# Patient Record
Sex: Female | Born: 1990 | Race: White | Hispanic: No | Marital: Single | Smoking: Current every day smoker
Health system: Southern US, Community
[De-identification: ages and names within clinical notes are randomized; demographics above are authoritative.]

## PROBLEM LIST (undated history)

## (undated) DIAGNOSIS — G43909 Migraine, unspecified, not intractable, without status migrainosus: Secondary | ICD-10-CM

## (undated) DIAGNOSIS — F419 Anxiety disorder, unspecified: Secondary | ICD-10-CM

## (undated) DIAGNOSIS — O1490 Unspecified pre-eclampsia, unspecified trimester: Secondary | ICD-10-CM

## (undated) HISTORY — DX: Anxiety disorder, unspecified: F41.9

## (undated) HISTORY — DX: Migraine, unspecified, not intractable, without status migrainosus: G43.909

## (undated) HISTORY — DX: Unspecified pre-eclampsia, unspecified trimester: O14.90

---

## 2012-08-01 DIAGNOSIS — O1490 Unspecified pre-eclampsia, unspecified trimester: Secondary | ICD-10-CM

## 2017-01-08 ENCOUNTER — Encounter: Payer: Self-pay | Admitting: Adult Health

## 2017-01-08 ENCOUNTER — Encounter (INDEPENDENT_AMBULATORY_CARE_PROVIDER_SITE_OTHER): Payer: Self-pay

## 2017-01-08 ENCOUNTER — Encounter: Payer: Self-pay | Admitting: *Deleted

## 2017-01-08 ENCOUNTER — Ambulatory Visit: Payer: BLUE CROSS/BLUE SHIELD | Admitting: Adult Health

## 2017-01-08 VITALS — BP 124/62 | HR 78 | Resp 16 | Ht 62.0 in | Wt 126.0 lb

## 2017-01-08 DIAGNOSIS — O09219 Supervision of pregnancy with history of pre-term labor, unspecified trimester: Secondary | ICD-10-CM

## 2017-01-08 DIAGNOSIS — N926 Irregular menstruation, unspecified: Secondary | ICD-10-CM | POA: Diagnosis not present

## 2017-01-08 DIAGNOSIS — O09211 Supervision of pregnancy with history of pre-term labor, first trimester: Secondary | ICD-10-CM

## 2017-01-08 DIAGNOSIS — O09891 Supervision of other high risk pregnancies, first trimester: Secondary | ICD-10-CM

## 2017-01-08 DIAGNOSIS — R110 Nausea: Secondary | ICD-10-CM | POA: Diagnosis not present

## 2017-01-08 DIAGNOSIS — Z331 Pregnant state, incidental: Secondary | ICD-10-CM | POA: Diagnosis not present

## 2017-01-08 DIAGNOSIS — O09291 Supervision of pregnancy with other poor reproductive or obstetric history, first trimester: Secondary | ICD-10-CM

## 2017-01-08 DIAGNOSIS — O3680X0 Pregnancy with inconclusive fetal viability, not applicable or unspecified: Secondary | ICD-10-CM | POA: Insufficient documentation

## 2017-01-08 DIAGNOSIS — Z3A01 Less than 8 weeks gestation of pregnancy: Secondary | ICD-10-CM | POA: Insufficient documentation

## 2017-01-08 DIAGNOSIS — O09899 Supervision of other high risk pregnancies, unspecified trimester: Secondary | ICD-10-CM | POA: Insufficient documentation

## 2017-01-08 DIAGNOSIS — Z8759 Personal history of other complications of pregnancy, childbirth and the puerperium: Secondary | ICD-10-CM | POA: Diagnosis not present

## 2017-01-08 DIAGNOSIS — O09299 Supervision of pregnancy with other poor reproductive or obstetric history, unspecified trimester: Secondary | ICD-10-CM | POA: Insufficient documentation

## 2017-01-08 DIAGNOSIS — Z3201 Encounter for pregnancy test, result positive: Secondary | ICD-10-CM | POA: Diagnosis not present

## 2017-01-08 LAB — POCT URINE PREGNANCY: Preg Test, Ur: POSITIVE — AB

## 2017-01-08 MED ORDER — PRENATAL GUMMIES/DHA & FA 0.4-32.5 MG PO CHEW: 2.0000 | CHEWABLE_TABLET | Freq: Every day | ORAL | Status: DC

## 2017-01-08 NOTE — Patient Instructions (Signed)
First Trimester of Pregnancy The first trimester of pregnancy is from week 1 until the end of week 13 (months 1 through 3). A week after a sperm fertilizes an egg, the egg will implant on the wall of the uterus. This embryo will begin to develop into a baby. Genes from you and your partner will form the baby. The female genes will determine whether the baby will be a boy or a girl. At 6-8 weeks, the eyes and face will be formed, and the heartbeat can be seen on ultrasound. At the end of 12 weeks, all the baby's organs will be formed. Now that you are pregnant, you will want to do everything you can to have a healthy baby. Two of the most important things are to get good prenatal care and to follow your health care provider's instructions. Prenatal care is all the medical care you receive before the baby's birth. This care will help prevent, find, and treat any problems during the pregnancy and childbirth. Body changes during your first trimester Your body goes through many changes during pregnancy. The changes vary from woman to woman.  You may gain or lose a couple of pounds at first.  You may feel sick to your stomach (nauseous) and you may throw up (vomit). If the vomiting is uncontrollable, call your health care provider.  You may tire easily.  You may develop headaches that can be relieved by medicines. All medicines should be approved by your health care provider.  You may urinate more often. Painful urination may mean you have a bladder infection.  You may develop heartburn as a result of your pregnancy.  You may develop constipation because certain hormones are causing the muscles that push stool through your intestines to slow down.  You may develop hemorrhoids or swollen veins (varicose veins).  Your breasts may begin to grow larger and become tender. Your nipples may stick out more, and the tissue that surrounds them (areola) may become darker.  Your gums may bleed and may be  sensitive to brushing and flossing.  Dark spots or blotches (chloasma, mask of pregnancy) may develop on your face. This will likely fade after the baby is born.  Your menstrual periods will stop.  You may have a loss of appetite.  You may develop cravings for certain kinds of food.  You may have changes in your emotions from day to day, such as being excited to be pregnant or being concerned that something may go wrong with the pregnancy and baby.  You may have more vivid and strange dreams.  You may have changes in your hair. These can include thickening of your hair, rapid growth, and changes in texture. Some women also have hair loss during or after pregnancy, or hair that feels dry or thin. Your hair will most likely return to normal after your baby is born.  What to expect at prenatal visits During a routine prenatal visit:  You will be weighed to make sure you and the baby are growing normally.  Your blood pressure will be taken.  Your abdomen will be measured to track your baby's growth.  The fetal heartbeat will be listened to between weeks 10 and 14 of your pregnancy.  Test results from any previous visits will be discussed.  Your health care provider may ask you:  How you are feeling.  If you are feeling the baby move.  If you have had any abnormal symptoms, such as leaking fluid, bleeding, severe headaches,   or abdominal cramping.  If you are using any tobacco products, including cigarettes, chewing tobacco, and electronic cigarettes.  If you have any questions.  Other tests that may be performed during your first trimester include:  Blood tests to find your blood type and to check for the presence of any previous infections. The tests will also be used to check for low iron levels (anemia) and protein on red blood cells (Rh antibodies). Depending on your risk factors, or if you previously had diabetes during pregnancy, you may have tests to check for high blood  sugar that affects pregnant women (gestational diabetes).  Urine tests to check for infections, diabetes, or protein in the urine.  An ultrasound to confirm the proper growth and development of the baby.  Fetal screens for spinal cord problems (spina bifida) and Down syndrome.  HIV (human immunodeficiency virus) testing. Routine prenatal testing includes screening for HIV, unless you choose not to have this test.  You may need other tests to make sure you and the baby are doing well.  Follow these instructions at home: Medicines  Follow your health care provider's instructions regarding medicine use. Specific medicines may be either safe or unsafe to take during pregnancy.  Take a prenatal vitamin that contains at least 600 micrograms (mcg) of folic acid.  If you develop constipation, try taking a stool softener if your health care provider approves. Eating and drinking  Eat a balanced diet that includes fresh fruits and vegetables, whole grains, good sources of protein such as meat, eggs, or tofu, and low-fat dairy. Your health care provider will help you determine the amount of weight gain that is right for you.  Avoid raw meat and uncooked cheese. These carry germs that can cause birth defects in the baby.  Eating four or five small meals rather than three large meals a day may help relieve nausea and vomiting. If you start to feel nauseous, eating a few soda crackers can be helpful. Drinking liquids between meals, instead of during meals, also seems to help ease nausea and vomiting.  Limit foods that are high in fat and processed sugars, such as fried and sweet foods.  To prevent constipation: ? Eat foods that are high in fiber, such as fresh fruits and vegetables, whole grains, and beans. ? Drink enough fluid to keep your urine clear or pale yellow. Activity  Exercise only as directed by your health care provider. Most women can continue their usual exercise routine during  pregnancy. Try to exercise for 30 minutes at least 5 days a week. Exercising will help you: ? Control your weight. ? Stay in shape. ? Be prepared for labor and delivery.  Experiencing pain or cramping in the lower abdomen or lower back is a good sign that you should stop exercising. Check with your health care provider before continuing with normal exercises.  Try to avoid standing for long periods of time. Move your legs often if you must stand in one place for a long time.  Avoid heavy lifting.  Wear low-heeled shoes and practice good posture.  You may continue to have sex unless your health care provider tells you not to. Relieving pain and discomfort  Wear a good support bra to relieve breast tenderness.  Take warm sitz baths to soothe any pain or discomfort caused by hemorrhoids. Use hemorrhoid cream if your health care provider approves.  Rest with your legs elevated if you have leg cramps or low back pain.  If you develop   varicose veins in your legs, wear support hose. Elevate your feet for 15 minutes, 3-4 times a day. Limit salt in your diet. Prenatal care  Schedule your prenatal visits by the twelfth week of pregnancy. They are usually scheduled monthly at first, then more often in the last 2 months before delivery.  Write down your questions. Take them to your prenatal visits.  Keep all your prenatal visits as told by your health care provider. This is important. Safety  Wear your seat belt at all times when driving.  Make a list of emergency phone numbers, including numbers for family, friends, the hospital, and police and fire departments. General instructions  Ask your health care provider for a referral to a local prenatal education class. Begin classes no later than the beginning of month 6 of your pregnancy.  Ask for help if you have counseling or nutritional needs during pregnancy. Your health care provider can offer advice or refer you to specialists for help  with various needs.  Do not use hot tubs, steam rooms, or saunas.  Do not douche or use tampons or scented sanitary pads.  Do not cross your legs for long periods of time.  Avoid cat litter boxes and soil used by cats. These carry germs that can cause birth defects in the baby and possibly loss of the fetus by miscarriage or stillbirth.  Avoid all smoking, herbs, alcohol, and medicines not prescribed by your health care provider. Chemicals in these products affect the formation and growth of the baby.  Do not use any products that contain nicotine or tobacco, such as cigarettes and e-cigarettes. If you need help quitting, ask your health care provider. You may receive counseling support and other resources to help you quit.  Schedule a dentist appointment. At home, brush your teeth with a soft toothbrush and be gentle when you floss. Contact a health care provider if:  You have dizziness.  You have mild pelvic cramps, pelvic pressure, or nagging pain in the abdominal area.  You have persistent nausea, vomiting, or diarrhea.  You have a bad smelling vaginal discharge.  You have pain when you urinate.  You notice increased swelling in your face, hands, legs, or ankles.  You are exposed to fifth disease or chickenpox.  You are exposed to German measles (rubella) and have never had it. Get help right away if:  You have a fever.  You are leaking fluid from your vagina.  You have spotting or bleeding from your vagina.  You have severe abdominal cramping or pain.  You have rapid weight gain or loss.  You vomit blood or material that looks like coffee grounds.  You develop a severe headache.  You have shortness of breath.  You have any kind of trauma, such as from a fall or a car accident. Summary  The first trimester of pregnancy is from week 1 until the end of week 13 (months 1 through 3).  Your body goes through many changes during pregnancy. The changes vary from  woman to woman.  You will have routine prenatal visits. During those visits, your health care provider will examine you, discuss any test results you may have, and talk with you about how you are feeling. This information is not intended to replace advice given to you by your health care provider. Make sure you discuss any questions you have with your health care provider. Document Released: 01/01/2001 Document Revised: 12/20/2015 Document Reviewed: 12/20/2015 [employer] Interactive Patient Education  2018 [employer]  

## 2017-01-08 NOTE — Progress Notes (Signed)
Subjective:     Patient ID: Kimberly Foster, female   DOB: 04/29/1990, 26 y.o.   MRN: [id number]  HPI Kimberly Foster is a 26 year old white female in for UPT. She has history of pre eclampsia and preterm delivery.   Review of Systems +missed period +nausea  Reviewed past medical,surgical, social and family history. Reviewed medications and allergies.     Objective:   Physical Exam BP 124/62 (BP Location: Left Arm, Patient Position: Sitting, Cuff Size: Normal)   Pulse 78   Resp 16   Ht 5\' 2"  (1.575 m)   Wt 126 lb (57.2 kg)   LMP 11/18/2016 (Exact Date)   BMI 23.05 kg/m UPT+, about 7+3 weeks by LMP with EDD 08/25/17.Skin warm and dry. Neck: mid line trachea, normal thyroid, good ROM, no lymphadenopathy noted. Lungs: clear to ausculation bilaterally. Cardiovascular: regular rate and rhythm.Abdomen is soft and non tender. PHQ 2 score 0.   [facility] and about after hours call service.   Assessment:     1. Pregnancy test positive   2. Less than [redacted] weeks gestation of pregnancy   3. Encounter to determine fetal viability of pregnancy, single or unspecified fetus   4. History of pre-eclampsia in prior pregnancy, currently pregnant in first trimester   5. History of preterm delivery, currently pregnant in first trimester       Plan:    Eat often Take Gummies Return in 2 weeks for dating US Review handouts on first trimester and by [facility]  [facility]

## 2017-01-20 ENCOUNTER — Encounter: Payer: Self-pay | Admitting: *Deleted

## 2017-01-20 DIAGNOSIS — F419 Anxiety disorder, unspecified: Secondary | ICD-10-CM | POA: Insufficient documentation

## 2017-01-22 ENCOUNTER — Ambulatory Visit (INDEPENDENT_AMBULATORY_CARE_PROVIDER_SITE_OTHER): Payer: BLUE CROSS/BLUE SHIELD

## 2017-01-22 DIAGNOSIS — O3680X0 Pregnancy with inconclusive fetal viability, not applicable or unspecified: Secondary | ICD-10-CM | POA: Diagnosis not present

## 2017-01-22 DIAGNOSIS — Z3A09 9 weeks gestation of pregnancy: Secondary | ICD-10-CM | POA: Diagnosis not present

## 2017-01-22 NOTE — Progress Notes (Signed)
US 9+2 wks,single IUP w/ys,positive fht 166 bpm,normal ovaries bilat,crl 23.3 mm,EDD 08/25/2017 by LMP

## 2017-02-05 ENCOUNTER — Ambulatory Visit: Payer: BLUE CROSS/BLUE SHIELD | Admitting: *Deleted

## 2017-02-05 ENCOUNTER — Ambulatory Visit (INDEPENDENT_AMBULATORY_CARE_PROVIDER_SITE_OTHER): Payer: BLUE CROSS/BLUE SHIELD | Admitting: Advanced Practice Midwife

## 2017-02-05 ENCOUNTER — Encounter: Payer: Self-pay | Admitting: Advanced Practice Midwife

## 2017-02-05 VITALS — BP 110/84 | HR 76 | Ht 61.0 in | Wt 125.0 lb

## 2017-02-05 DIAGNOSIS — O99321 Drug use complicating pregnancy, first trimester: Secondary | ICD-10-CM

## 2017-02-05 DIAGNOSIS — F17210 Nicotine dependence, cigarettes, uncomplicated: Secondary | ICD-10-CM

## 2017-02-05 DIAGNOSIS — O09291 Supervision of pregnancy with other poor reproductive or obstetric history, first trimester: Secondary | ICD-10-CM

## 2017-02-05 DIAGNOSIS — Z3A11 11 weeks gestation of pregnancy: Secondary | ICD-10-CM

## 2017-02-05 DIAGNOSIS — O09211 Supervision of pregnancy with history of pre-term labor, first trimester: Secondary | ICD-10-CM

## 2017-02-05 DIAGNOSIS — O9989 Other specified diseases and conditions complicating pregnancy, childbirth and the puerperium: Secondary | ICD-10-CM

## 2017-02-05 DIAGNOSIS — Z3682 Encounter for antenatal screening for nuchal translucency: Secondary | ICD-10-CM

## 2017-02-05 DIAGNOSIS — Z3481 Encounter for supervision of other normal pregnancy, first trimester: Secondary | ICD-10-CM

## 2017-02-05 DIAGNOSIS — O09891 Supervision of other high risk pregnancies, first trimester: Secondary | ICD-10-CM

## 2017-02-05 DIAGNOSIS — R51 Headache: Secondary | ICD-10-CM

## 2017-02-05 DIAGNOSIS — Z331 Pregnant state, incidental: Secondary | ICD-10-CM

## 2017-02-05 DIAGNOSIS — Z1389 Encounter for screening for other disorder: Secondary | ICD-10-CM

## 2017-02-05 DIAGNOSIS — O34219 Maternal care for unspecified type scar from previous cesarean delivery: Secondary | ICD-10-CM

## 2017-02-05 DIAGNOSIS — F17200 Nicotine dependence, unspecified, uncomplicated: Secondary | ICD-10-CM | POA: Insufficient documentation

## 2017-02-05 DIAGNOSIS — O0990 Supervision of high risk pregnancy, unspecified, unspecified trimester: Secondary | ICD-10-CM | POA: Insufficient documentation

## 2017-02-05 LAB — POCT URINALYSIS DIPSTICK
GLUCOSE UA: NEGATIVE
Ketones, UA: NEGATIVE
Leukocytes, UA: NEGATIVE
Nitrite, UA: POSITIVE
Protein, UA: NEGATIVE
RBC UA: NEGATIVE

## 2017-02-05 MED ORDER — BUTALBITAL-APAP-CAFFEINE 50-325-40 MG PO TABS: 1.0000 | ORAL_TABLET | Freq: Four times a day (QID) | ORAL | 0 refills | Status: AC | PRN

## 2017-02-05 MED ORDER — ASPIRIN EC 81 MG PO TBEC: 81.0000 mg | DELAYED_RELEASE_TABLET | Freq: Every day | ORAL | 6 refills | Status: DC

## 2017-02-05 NOTE — Patient Instructions (Addendum)
 First Trimester of Pregnancy The first trimester of pregnancy is from week 1 until the end of week 12 (months 1 through 3). A week after a sperm fertilizes an egg, the egg will implant on the wall of the uterus. This embryo will begin to develop into a baby. Genes from you and your partner are forming the baby. The female genes determine whether the baby is a boy or a girl. At 6-8 weeks, the eyes and face are formed, and the heartbeat can be seen on ultrasound. At the end of 12 weeks, all the baby's organs are formed.  Now that you are pregnant, you will want to do everything you can to have a healthy baby. Two of the most important things are to get good prenatal care and to follow your health care provider's instructions. Prenatal care is all the medical care you receive before the baby's birth. This care will help prevent, find, and treat any problems during the pregnancy and childbirth. BODY CHANGES Your body goes through many changes during pregnancy. The changes vary from woman to woman.   You may gain or lose a couple of pounds at first.  You may feel sick to your stomach (nauseous) and throw up (vomit). If the vomiting is uncontrollable, call your health care provider.  You may tire easily.  You may develop headaches that can be relieved by medicines approved by your health care provider.  You may urinate more often. Painful urination may mean you have a bladder infection.  You may develop heartburn as a result of your pregnancy.  You may develop constipation because certain hormones are causing the muscles that push waste through your intestines to slow down.  You may develop hemorrhoids or swollen, bulging veins (varicose veins).  Your breasts may begin to grow larger and become tender. Your nipples may stick out more, and the tissue that surrounds them (areola) may become darker.  Your gums may bleed and may be sensitive to brushing and flossing.  Dark spots or blotches  (chloasma, mask of pregnancy) may develop on your face. This will likely fade after the baby is born.  Your menstrual periods will stop.  You may have a loss of appetite.  You may develop cravings for certain kinds of food.  You may have changes in your emotions from day to day, such as being excited to be pregnant or being concerned that something may go wrong with the pregnancy and baby.  You may have more vivid and strange dreams.  You may have changes in your hair. These can include thickening of your hair, rapid growth, and changes in texture. Some women also have hair loss during or after pregnancy, or hair that feels dry or thin. Your hair will most likely return to normal after your baby is born. WHAT TO EXPECT AT YOUR PRENATAL VISITS During a routine prenatal visit:  You will be weighed to make sure you and the baby are growing normally.  Your blood pressure will be taken.  Your abdomen will be measured to track your baby's growth.  The fetal heartbeat will be listened to starting around week 10 or 12 of your pregnancy.  Test results from any previous visits will be discussed. Your health care provider may ask you:  How you are feeling.  If you are feeling the baby move.  If you have had any abnormal symptoms, such as leaking fluid, bleeding, severe headaches, or abdominal cramping.  If you have any questions. Other   tests that may be performed during your first trimester include:  Blood tests to find your blood type and to check for the presence of any previous infections. They will also be used to check for low iron levels (anemia) and Rh antibodies. Later in the pregnancy, blood tests for diabetes will be done along with other tests if problems develop.  Urine tests to check for infections, diabetes, or protein in the urine.  An ultrasound to confirm the proper growth and development of the baby.  An amniocentesis to check for possible genetic problems.  Fetal  screens for spina bifida and Down syndrome.  You may need other tests to make sure you and the baby are doing well. HOME CARE INSTRUCTIONS  Medicines  Follow your health care provider's instructions regarding medicine use. Specific medicines may be either safe or unsafe to take during pregnancy.  Take your prenatal vitamins as directed.  If you develop constipation, try taking a stool softener if your health care provider approves. Diet  Eat regular, well-balanced meals. Choose a variety of foods, such as meat or vegetable-based protein, fish, milk and low-fat dairy products, vegetables, fruits, and whole grain breads and cereals. Your health care provider will help you determine the amount of weight gain that is right for you.  Avoid raw meat and uncooked cheese. These carry germs that can cause birth defects in the baby.  Eating four or five small meals rather than three large meals a day may help relieve nausea and vomiting. If you start to feel nauseous, eating a few soda crackers can be helpful. Drinking liquids between meals instead of during meals also seems to help nausea and vomiting.  If you develop constipation, eat more high-fiber foods, such as fresh vegetables or fruit and whole grains. Drink enough fluids to keep your urine clear or pale yellow. Activity and Exercise  Exercise only as directed by your health care provider. Exercising will help you:  Control your weight.  Stay in shape.  Be prepared for labor and delivery.  Experiencing pain or cramping in the lower abdomen or low back is a good sign that you should stop exercising. Check with your health care provider before continuing normal exercises.  Try to avoid standing for long periods of time. Move your legs often if you must stand in one place for a long time.  Avoid heavy lifting.  Wear low-heeled shoes, and practice good posture.  You may continue to have sex unless your health care provider directs you  otherwise. Relief of Pain or Discomfort  Wear a good support bra for breast tenderness.   Take warm sitz baths to soothe any pain or discomfort caused by hemorrhoids. Use hemorrhoid cream if your health care provider approves.   Rest with your legs elevated if you have leg cramps or low back pain.  If you develop varicose veins in your legs, wear support hose. Elevate your feet for 15 minutes, 3-4 times a day. Limit salt in your diet. Prenatal Care  Schedule your prenatal visits by the twelfth week of pregnancy. They are usually scheduled monthly at first, then more often in the last 2 months before delivery.  Write down your questions. Take them to your prenatal visits.  Keep all your prenatal visits as directed by your health care provider. Safety  Wear your seat belt at all times when driving.  Make a list of emergency phone numbers, including numbers for family, friends, the hospital, and police and fire departments. General   Tips  Ask your health care provider for a referral to a local prenatal education class. Begin classes no later than at the beginning of month 6 of your pregnancy.  Ask for help if you have counseling or nutritional needs during pregnancy. Your health care provider can offer advice or refer you to specialists for help with various needs.  Do not use hot tubs, steam rooms, or saunas.  Do not douche or use tampons or scented sanitary pads.  Do not cross your legs for long periods of time.  Avoid cat litter boxes and soil used by cats. These carry germs that can cause birth defects in the baby and possibly loss of the fetus by miscarriage or stillbirth.  Avoid all smoking, herbs, alcohol, and medicines not prescribed by your health care provider. Chemicals in these affect the formation and growth of the baby.  Schedule a dentist appointment. At home, brush your teeth with a soft toothbrush and be gentle when you floss. SEEK MEDICAL CARE IF:   You have  dizziness.  You have mild pelvic cramps, pelvic pressure, or nagging pain in the abdominal area.  You have persistent nausea, vomiting, or diarrhea.  You have a bad smelling vaginal discharge.  You have pain with urination.  You notice increased swelling in your face, hands, legs, or ankles. SEEK IMMEDIATE MEDICAL CARE IF:   You have a fever.  You are leaking fluid from your vagina.  You have spotting or bleeding from your vagina.  You have severe abdominal cramping or pain.  You have rapid weight gain or loss.  You vomit blood or material that looks like coffee grounds.  You are exposed to German measles and have never had them.  You are exposed to fifth disease or chickenpox.  You develop a severe headache.  You have shortness of breath.  You have any kind of trauma, such as from a fall or a car accident. Document Released: 01/01/2001 Document Revised: 05/24/2013 Document Reviewed: 11/17/2012 Exit[employer] Patient Information 2015 [employer]. This information is not intended to replace advice given to you by your health care provider. Make sure you discuss any questions you have with your health care provider.   Nausea & Vomiting  Have saltine crackers or pretzels by your bed and eat a few bites before you raise your head out of bed in the morning  Eat small frequent meals throughout the day instead of large meals  Drink plenty of fluids throughout the day to stay hydrated, just don't drink a lot of fluids with your meals.  This can make your stomach fill up faster making you feel sick  Do not brush your teeth right after you eat  Products with real ginger are good for nausea, like ginger ale and ginger hard candy Make sure it says made with real ginger!  Sucking on sour candy like lemon heads is also good for nausea  If your prenatal vitamins make you nauseated, take them at night so you will sleep through the nausea  Sea Bands  If you feel like you need  medicine for the nausea & vomiting please let us know  If you are unable to keep any fluids or food down please let us know   Constipation  Drink plenty of fluid, preferably water, throughout the day  Eat foods high in fiber such as fruits, vegetables, and grains  Exercise, such as walking, is a good way to keep your bowels regular  Drink warm fluids, especially warm   prune juice, or decaf coffee  Eat a 1/2 cup of real oatmeal (not instant), 1/2 cup applesauce, and 1/2-1 cup warm prune juice every day  If needed, you may take Colace (docusate sodium) stool softener once or twice a day to help keep the stool soft. If you are pregnant, wait until you are out of your first trimester (12-14 weeks of pregnancy)  If you still are having problems with constipation, you may take Miralax once daily as needed to help keep your bowels regular.  If you are pregnant, wait until you are out of your first trimester (12-14 weeks of pregnancy)  Safe Medications in Pregnancy   Acne: Benzoyl Peroxide Salicylic Acid  Backache/Headache: Tylenol: 2 regular strength every 4 hours OR              2 Extra strength every 6 hours  Colds/Coughs/Allergies: Benadryl (alcohol free) 25 mg every 6 hours as needed Breath right strips Claritin Cepacol throat lozenges Chloraseptic throat spray Cold-Eeze- up to three times per day Cough drops, alcohol free Flonase (by prescription only) Guaifenesin Mucinex Robitussin DM (plain only, alcohol free) Saline nasal spray/drops Sudafed (pseudoephedrine) & Actifed ** use only after [redacted] weeks gestation and if you do not have high blood pressure Tylenol Vicks Vaporub Zinc lozenges Zyrtec   Constipation: Colace Ducolax suppositories Fleet enema Glycerin suppositories Metamucil Milk of magnesia Miralax Senokot Smooth move tea  Diarrhea: Kaopectate Imodium A-D  *NO pepto Bismol  Hemorrhoids: Anusol Anusol HC Preparation  H Tucks  Indigestion: Tums Maalox Mylanta Zantac  Pepcid  Insomnia: Benadryl (alcohol free) 25mg every 6 hours as needed Tylenol PM Unisom, no Gelcaps  Leg Cramps: Tums MagGel  Nausea/Vomiting:  Bonine Dramamine Emetrol Ginger extract Sea bands Meclizine  Nausea medication to take during pregnancy:  Unisom (doxylamine succinate 25 mg tablets) Take one tablet daily at bedtime. If symptoms are not adequately controlled, the dose can be increased to a maximum recommended dose of two tablets daily (1/2 tablet in the morning, 1/2 tablet mid-afternoon and one at bedtime). Vitamin B6 100mg tablets. Take one tablet twice a day (up to 200 mg per day).  Skin Rashes: Aveeno products Benadryl cream or 25mg every 6 hours as needed Calamine Lotion 1% cortisone cream  Yeast infection: Gyne-lotrimin 7 Monistat 7   **If taking multiple medications, please check labels to avoid duplicating the same active ingredients **take medication as directed on the label ** Do not exceed 4000 mg of tylenol in 24 hours **Do not take medications that contain aspirin or ibuprofen     For Headaches:   Stay well hydrated, drink enough water so that your urine is clear, sometimes if you are dehydrated you can get headaches  Eat small frequent meals and snacks, sometimes if you are hungry you can get headaches  Sometimes you get headaches during pregnancy from the pregnancy hormones  You can try tylenol (1-2 regular strength 325mg or 1-2 extra strength 500mg) as directed on the box. The least amount of medication that works is best.   Cool compresses (cool wet washcloth or ice pack) to area of head that is hurting  You can also try drinking a caffeinated drink to see if this will help  If not helping, try below:  For Prevention of Headaches/Migraines:  CoQ10 100mg three times daily  Vitamin B2 400mg daily  Magnesium Oxide 400-600mg daily  If You Get a Bad  Headache/Migraine:  Benadryl 25mg   Magnesium Oxide  1 large Gatorade  2 extra   strength Tylenol (1,000mg total)  1 cup coffee or Coke  If this doesn't help please call us @ [phone]   

## 2017-02-05 NOTE — Progress Notes (Signed)
Subjective:    Kimberly Foster is a G2P0101 3339w2d being seen today for her first obstetrical visit.  Her obstetrical history is significant for 31 C/S for Severe Preeclampsia. .Had planned outpt mgt, but then had NRFHR undergo a trial of labor.  maybe TOLAC.  Pregnancy history fully reviewed.  Patient has daily HAs.   Vitals:   02/05/17 1015 02/05/17 1016  BP: 110/84   Pulse: 76   Weight: 125 lb (56.7 kg)   Height:  5\' 1"  (1.549 m)    HISTORY: OB History  Gravida Para Term Preterm AB Living  2 1 0 1   1  SAB TAB Ectopic Multiple Live Births          1    # Outcome Date GA Lbr Len/2nd Weight Sex Delivery Anes PTL Lv  2 Current           1 Preterm 08/01/12 182w0d  2 lb 2.7 oz (0.983 kg) M CS-LTranv Other N LIV     Complications: Preeclampsia,Fetal distress before labor in liveborn infant     Birth Comments: pre eclampsia     Past Medical History:  Diagnosis Date  . Anxiety   . Migraines   . Pre-eclampsia    Past Surgical History:  Procedure Laterality Date  . CESAREAN SECTION     Family History  Problem Relation Age of Onset  . Mesothelioma Paternal Grandfather   . Hypertension Paternal Grandfather   . COPD Paternal Grandfather   . Leukemia Paternal Grandmother   . Hypertension Maternal Grandmother   . Hypertension Maternal Grandfather   . Heart attack Maternal Grandfather   . Heart failure Maternal Grandfather   . Hypertension Father   . Diverticulitis Father   . Hypertension Mother   . Cancer Paternal Uncle   . Arrhythmia Maternal Uncle   . Diabetes Paternal Aunt      Exam                                      System:     Skin: normal coloration and turgor, no rashes    Neurologic: oriented, normal, normal mood   Extremities: normal strength, tone, and muscle mass   HEENT PERRLA   Mouth/Teeth mucous membranes moist, normal dentition   Neck supple and no masses   Cardiovascular: regular rate and rhythm   Respiratory:  appears well, vitals  normal, no respiratory distress, acyanotic   Abdomen: soft, non-tender;  FHR: 160US        The [facility] with multiple MDs and other Advanced Practice Providers was explained to patient; also emphasized that residents, students are part of our team.  Assessment:    Pregnancy: G2P0101 Patient Active Problem List   Diagnosis Date Noted  . Supervision of normal pregnancy 02/05/2017  . Smoker 02/05/2017  . Anxiety 01/20/2017  . Pregnancy test positive 01/08/2017  . History of preterm delivery, currently pregnant in first trimester 01/08/2017  . History of pre-eclampsia in prior pregnancy, currently pregnant in first trimester 01/08/2017        Plan:     Initial labs drawn. Continue prenatal vitamins  Records from CS requested ASA 81mg  >12 weeks Post dated rx (for 12 weeks) for fioricet prn HA Problem list reviewed and updated  Reviewed n/v relief measures and warning s/s to report  Reviewed recommended weight gain based on pre-gravid BMI  Encouraged well-balanced diet Genetic Screening discussed Integrated Screen: requested.  Ultrasound discussed; fetal survey: requested.  Return in about 2 weeks (around 02/21/2017) for NT/IT/LROB.  CRESENZO-DISHMAN,Renuka Farfan 02/05/2017

## 2017-02-06 LAB — OBSTETRIC PANEL, INCLUDING HIV
Antibody Screen: NEGATIVE
BASOS ABS: 0 10*3/uL (ref 0.0–0.2)
Basos: 0 %
EOS (ABSOLUTE): 0.1 10*3/uL (ref 0.0–0.4)
EOS: 1 %
HIV Screen 4th Generation wRfx: NONREACTIVE
Hematocrit: 37.7 % (ref 34.0–46.6)
Hemoglobin: 13 g/dL (ref 11.1–15.9)
Hepatitis B Surface Ag: NEGATIVE
IMMATURE GRANULOCYTES: 0 %
Immature Grans (Abs): 0 10*3/uL (ref 0.0–0.1)
LYMPHS ABS: 2.6 10*3/uL (ref 0.7–3.1)
Lymphs: 23 %
MCH: 29.3 pg (ref 26.6–33.0)
MCHC: 34.5 g/dL (ref 31.5–35.7)
MCV: 85 fL (ref 79–97)
MONOS ABS: 0.6 10*3/uL (ref 0.1–0.9)
Monocytes: 5 %
NEUTROS PCT: 71 %
Neutrophils Absolute: 7.9 10*3/uL — ABNORMAL HIGH (ref 1.4–7.0)
PLATELETS: 321 10*3/uL (ref 150–379)
RBC: 4.44 x10E6/uL (ref 3.77–5.28)
RDW: 15.3 % (ref 12.3–15.4)
RH TYPE: POSITIVE
RPR Ser Ql: NONREACTIVE
Rubella Antibodies, IGG: 1.35 index (ref >0.99)
WBC: 11.2 10*3/uL — AB (ref 3.4–10.8)

## 2017-02-06 LAB — MICROSCOPIC EXAMINATION: CASTS: NONE SEEN /LPF

## 2017-02-06 LAB — PMP SCREEN PROFILE (10S), URINE
AMPHETAMINE SCREEN URINE: NEGATIVE ng/mL
BARBITURATE SCREEN URINE: NEGATIVE ng/mL
BENZODIAZEPINE SCREEN, URINE: NEGATIVE ng/mL
CANNABINOIDS UR QL SCN: NEGATIVE ng/mL
COCAINE(METAB.)SCREEN, URINE: NEGATIVE ng/mL
Creatinine(Crt), U: 96.5 mg/dL (ref 20.0–300.0)
Methadone Screen, Urine: NEGATIVE ng/mL
OXYCODONE+OXYMORPHONE UR QL SCN: NEGATIVE ng/mL
Opiate Scrn, Ur: NEGATIVE ng/mL
PHENCYCLIDINE QUANTITATIVE URINE: NEGATIVE ng/mL
Ph of Urine: 7.6 (ref 4.5–8.9)
Propoxyphene Scrn, Ur: NEGATIVE ng/mL

## 2017-02-06 LAB — URINALYSIS, ROUTINE W REFLEX MICROSCOPIC
Bilirubin, UA: NEGATIVE
GLUCOSE, UA: NEGATIVE
Ketones, UA: NEGATIVE
LEUKOCYTES UA: NEGATIVE
Nitrite, UA: POSITIVE — AB
PROTEIN UA: NEGATIVE
RBC, UA: NEGATIVE
Specific Gravity, UA: 1.017 (ref 1.005–1.030)
UUROB: 0.2 mg/dL (ref 0.2–1.0)
pH, UA: 8.5 — ABNORMAL HIGH (ref 5.0–7.5)

## 2017-02-06 LAB — MED LIST OPTION NOT SELECTED

## 2017-02-07 ENCOUNTER — Encounter: Payer: Self-pay | Admitting: Advanced Practice Midwife

## 2017-02-07 ENCOUNTER — Other Ambulatory Visit: Payer: Self-pay | Admitting: Advanced Practice Midwife

## 2017-02-07 LAB — GC/CHLAMYDIA PROBE AMP
CHLAMYDIA, DNA PROBE: NEGATIVE
NEISSERIA GONORRHOEAE BY PCR: NEGATIVE

## 2017-02-07 MED ORDER — CEPHALEXIN 500 MG PO CAPS: 500.0000 mg | ORAL_CAPSULE | Freq: Three times a day (TID) | ORAL | 0 refills | Status: DC

## 2017-02-07 NOTE — Progress Notes (Signed)
Keflex for gram neg rods

## 2017-02-10 ENCOUNTER — Encounter: Payer: Self-pay | Admitting: Advanced Practice Midwife

## 2017-02-11 LAB — URINE CULTURE

## 2017-02-20 ENCOUNTER — Ambulatory Visit (INDEPENDENT_AMBULATORY_CARE_PROVIDER_SITE_OTHER): Payer: BLUE CROSS/BLUE SHIELD | Admitting: Obstetrics and Gynecology

## 2017-02-20 ENCOUNTER — Encounter: Payer: Self-pay | Admitting: Obstetrics and Gynecology

## 2017-02-20 ENCOUNTER — Ambulatory Visit (INDEPENDENT_AMBULATORY_CARE_PROVIDER_SITE_OTHER): Payer: BLUE CROSS/BLUE SHIELD

## 2017-02-20 VITALS — BP 100/60 | HR 77 | Wt 122.6 lb

## 2017-02-20 DIAGNOSIS — F17210 Nicotine dependence, cigarettes, uncomplicated: Secondary | ICD-10-CM | POA: Diagnosis not present

## 2017-02-20 DIAGNOSIS — Z3682 Encounter for antenatal screening for nuchal translucency: Secondary | ICD-10-CM | POA: Diagnosis not present

## 2017-02-20 DIAGNOSIS — Z3A13 13 weeks gestation of pregnancy: Secondary | ICD-10-CM

## 2017-02-20 DIAGNOSIS — O09211 Supervision of pregnancy with history of pre-term labor, first trimester: Secondary | ICD-10-CM | POA: Diagnosis not present

## 2017-02-20 DIAGNOSIS — O09291 Supervision of pregnancy with other poor reproductive or obstetric history, first trimester: Secondary | ICD-10-CM

## 2017-02-20 DIAGNOSIS — O99331 Smoking (tobacco) complicating pregnancy, first trimester: Secondary | ICD-10-CM | POA: Diagnosis not present

## 2017-02-20 DIAGNOSIS — Z3402 Encounter for supervision of normal first pregnancy, second trimester: Secondary | ICD-10-CM

## 2017-02-20 DIAGNOSIS — Z1389 Encounter for screening for other disorder: Secondary | ICD-10-CM

## 2017-02-20 DIAGNOSIS — Z331 Pregnant state, incidental: Secondary | ICD-10-CM

## 2017-02-20 DIAGNOSIS — Z3481 Encounter for supervision of other normal pregnancy, first trimester: Secondary | ICD-10-CM

## 2017-02-20 LAB — POCT URINALYSIS DIPSTICK
Blood, UA: NEGATIVE
Glucose, UA: NEGATIVE
KETONES UA: NEGATIVE
Leukocytes, UA: NEGATIVE
Nitrite, UA: NEGATIVE
Protein, UA: NEGATIVE

## 2017-02-20 NOTE — Progress Notes (Signed)
LOW-RISK PREGNANCY VISIT Patient name: Kimberly Foster MRN [id number]  Date of birth: Oct 07, 1990 Chief Complaint:   Routine Prenatal Visit (1st IT)  History of Present Illness:   Kimberly KosHeather Magowan is a 27 y.o. 752P0101 female at 2423w3d with an Estimated Date of Delivery: 08/25/17 being seen today for ongoing management of a low-risk pregnancy.  Today she reports no complaints.  . Vag. Bleeding: None.   . denies leaking of fluid. Review of Systems:   Pertinent items are noted in HPI Denies abnormal vaginal discharge w/ itching/odor/irritation, headaches, visual changes, shortness of breath, chest pain, abdominal pain, severe nausea/vomiting, or problems with urination or bowel movements unless otherwise stated above. Pertinent History Reviewed:  Reviewed past medical,surgical, social, obstetrical and family history.  Reviewed problem list, medications and allergies. Physical Assessment:   Vitals:   02/20/17 1042  BP: 100/60  Pulse: 77  Weight: 122 lb 9.6 oz (55.6 kg)  Body mass index is 23.17 kg/m.        Physical Examination:   General appearance: Well appearing, and in no distress  Mental status: Alert, oriented to person, place, and time  Skin: Warm & dry  Cardiovascular: Normal heart rate noted  Respiratory: Normal respiratory effort, no distress  Abdomen: Soft, gravid, nontender  Pelvic: Cervical exam deferred         Extremities: Edema: None  Fetal Status:          Results for orders placed or performed in visit on 02/20/17 (from the past 24 hour(s))  POCT urinalysis dipstick   Collection Time: 02/20/17 10:43 AM  Result Value Ref Range   Color, UA     Clarity, UA     Glucose, UA neg    Bilirubin, UA     Ketones, UA neg    Spec Grav, UA  1.010 - 1.025   Blood, UA neg    pH, UA  5.0 - 8.0   Protein, UA neg    Urobilinogen, UA  0.2 or 1.0 E.U./dL   Nitrite, UA neg    Leukocytes, UA Negative Negative   Appearance     Odor      Assessment & Plan:  1) Low-risk  pregnancy G2P0101 at 6623w3d with an Estimated Date of Delivery: 08/25/17   2) Hx of severe pre-eclampsia, delivered at 31 weeks   Meds: baby aspirin, 81 mg   Labs/procedures today: U/S US 13+3 wks,measurements c/w dates,normal ovaries bilat,fundal pl gr 0,NB present,NT 1.6 mm,crl 71.96 mm  Plan:  Continue routine LROB obstetrical care,             Request records again, pt to work with front desk personnel  Reviewed: Preterm labor symptoms and general obstetric precautions including but not limited to vaginal bleeding, contractions, leaking of fluid and fetal movement were reviewed in detail with the patient.  All questions were answered  Follow-up: Return in about 4 weeks (around 03/20/2017), or if symptoms worsen or fail to improve, for LROB, Labs: 2nd IT.  Orders Placed This Encounter  Procedures  . Integrated 1  . POCT urinalysis dipstick    By signing my name below, I, Izna Ahmed, attest that this documentation has been prepared under the direction and in the presence of Tilda BurrowFerguson, Dietrich Samuelson V, MD. Electronically Signed: Redge GainerIzna Ahmed, Medical Scribe. 02/20/17. 11:00 AM.  I personally performed the services described in this documentation, which was SCRIBED in my presence. The recorded information has been reviewed and considered accurate. It has been edited as necessary during review.  Jonnie Kind, MD

## 2017-02-20 NOTE — Progress Notes (Signed)
US 13+3 wks,measurements c/w dates,normal ovaries bilat,fundal pl gr 0,NB present,NT 1.6 mm,crl 71.96 mm

## 2017-02-24 LAB — INTEGRATED 1
Crown Rump Length: 72 mm
Gest. Age on Collection Date: 13.1 weeks
Maternal Age at EDD: 24.7 yr
NUCHAL TRANSLUCENCY (NT): 1.6 mm
Number of Fetuses: 1
PAPP-A VALUE: 1093.7 ng/mL
WEIGHT: 123 [lb_av]

## 2017-03-20 ENCOUNTER — Ambulatory Visit (INDEPENDENT_AMBULATORY_CARE_PROVIDER_SITE_OTHER): Payer: BLUE CROSS/BLUE SHIELD | Admitting: Obstetrics and Gynecology

## 2017-03-20 ENCOUNTER — Encounter: Payer: Self-pay | Admitting: Obstetrics and Gynecology

## 2017-03-20 VITALS — BP 112/70 | HR 66 | Wt 124.2 lb

## 2017-03-20 DIAGNOSIS — R51 Headache: Secondary | ICD-10-CM

## 2017-03-20 DIAGNOSIS — Z331 Pregnant state, incidental: Secondary | ICD-10-CM

## 2017-03-20 DIAGNOSIS — Z1389 Encounter for screening for other disorder: Secondary | ICD-10-CM

## 2017-03-20 DIAGNOSIS — Z3A17 17 weeks gestation of pregnancy: Secondary | ICD-10-CM

## 2017-03-20 DIAGNOSIS — Z1379 Encounter for other screening for genetic and chromosomal anomalies: Secondary | ICD-10-CM

## 2017-03-20 DIAGNOSIS — O34219 Maternal care for unspecified type scar from previous cesarean delivery: Secondary | ICD-10-CM

## 2017-03-20 DIAGNOSIS — O9989 Other specified diseases and conditions complicating pregnancy, childbirth and the puerperium: Secondary | ICD-10-CM

## 2017-03-20 DIAGNOSIS — Z3482 Encounter for supervision of other normal pregnancy, second trimester: Secondary | ICD-10-CM

## 2017-03-20 LAB — POCT URINALYSIS DIPSTICK
GLUCOSE UA: NEGATIVE
Ketones, UA: NEGATIVE
LEUKOCYTES UA: NEGATIVE
Nitrite, UA: NEGATIVE
Protein, UA: NEGATIVE
RBC UA: NEGATIVE

## 2017-03-20 NOTE — Progress Notes (Signed)
[id number]  Estimated Date of Delivery: 08/25/17 LROB 431w3d  Chief Complaint  Patient presents with  . Routine Prenatal Visit    2nd IT; still having bad headaches  ____no visual abnormalities. Tried Fioricet with some relief ,iwll  refil  Patient complaints:h/a only. Wants to consider BTL. TIMING options at c/s or at  6 months laparoscopic discussed.  Increased risk of pt ambivalence when under 25 discussed.  Patient reports  Too early for fetal movement,                           denies any bleeding , rupture of membranes,or regular contractions.  Blood pressure 112/70, pulse 66, weight 124 lb 3.2 oz (56.3 kg), last menstrual period 11/18/2016.   Urine results:notable for neg protein refer to the ob flow sheet for FH and FHR, ,                          Physical Examination: General appearance - alert, well appearing, and in no distress                                      Abdomen - FH s + 3 ,                                                         -FHR 134                                                         soft, nontender, nondistended, no masses or organomegaly                                      Pelvic -                                             Questions were answered. Assessment: LROB G2P0101 @ 5231w3d lrob  Prior c/s for repeat.  Considering BTL at c/s vs interval tubal  Plan:  Continued routine obstetrical care,   F/u in 1/5  weeks for u.s anatomy Given brochure for interval tubal

## 2017-03-21 ENCOUNTER — Encounter: Payer: BLUE CROSS/BLUE SHIELD | Admitting: Obstetrics & Gynecology

## 2017-03-22 LAB — INTEGRATED 2
AFP MoM: 0.95
Alpha-Fetoprotein: 37.6 ng/mL
CROWN RUMP LENGTH: 72 mm
DIA MoM: 0.84
DIA VALUE: 159 pg/mL
Estriol, Unconjugated: 1.11 ng/mL
GESTATIONAL AGE: 17.1 wk
Gest. Age on Collection Date: 13.1 weeks
Maternal Age at EDD: 24.7 yr
NUCHAL TRANSLUCENCY MOM: 0.9
Nuchal Translucency (NT): 1.6 mm
Number of Fetuses: 1
PAPP-A MOM: 0.7
PAPP-A VALUE: 1093.7 ng/mL
TEST RESULTS: NEGATIVE
Weight: 123 [lb_av]
Weight: 124 [lb_av]
hCG MoM: 0.42
hCG Value: 12.6 IU/mL
uE3 MoM: 1.05

## 2017-04-02 ENCOUNTER — Other Ambulatory Visit: Payer: Self-pay | Admitting: Obstetrics and Gynecology

## 2017-04-02 DIAGNOSIS — Z363 Encounter for antenatal screening for malformations: Secondary | ICD-10-CM

## 2017-04-03 ENCOUNTER — Ambulatory Visit (INDEPENDENT_AMBULATORY_CARE_PROVIDER_SITE_OTHER): Payer: BLUE CROSS/BLUE SHIELD

## 2017-04-03 ENCOUNTER — Encounter: Payer: Self-pay | Admitting: Obstetrics & Gynecology

## 2017-04-03 ENCOUNTER — Ambulatory Visit (INDEPENDENT_AMBULATORY_CARE_PROVIDER_SITE_OTHER): Payer: BLUE CROSS/BLUE SHIELD | Admitting: Obstetrics & Gynecology

## 2017-04-03 VITALS — BP 102/60 | HR 70 | Wt 126.0 lb

## 2017-04-03 DIAGNOSIS — Z1389 Encounter for screening for other disorder: Secondary | ICD-10-CM

## 2017-04-03 DIAGNOSIS — F17210 Nicotine dependence, cigarettes, uncomplicated: Secondary | ICD-10-CM | POA: Diagnosis not present

## 2017-04-03 DIAGNOSIS — Z363 Encounter for antenatal screening for malformations: Secondary | ICD-10-CM

## 2017-04-03 DIAGNOSIS — Z331 Pregnant state, incidental: Secondary | ICD-10-CM | POA: Diagnosis not present

## 2017-04-03 DIAGNOSIS — O09292 Supervision of pregnancy with other poor reproductive or obstetric history, second trimester: Secondary | ICD-10-CM

## 2017-04-03 DIAGNOSIS — Z3402 Encounter for supervision of normal first pregnancy, second trimester: Secondary | ICD-10-CM

## 2017-04-03 DIAGNOSIS — Z3A19 19 weeks gestation of pregnancy: Secondary | ICD-10-CM

## 2017-04-03 DIAGNOSIS — Z3482 Encounter for supervision of other normal pregnancy, second trimester: Secondary | ICD-10-CM

## 2017-04-03 DIAGNOSIS — O99332 Smoking (tobacco) complicating pregnancy, second trimester: Secondary | ICD-10-CM

## 2017-04-03 LAB — POCT URINALYSIS DIPSTICK
Glucose, UA: NEGATIVE
PROTEIN UA: NEGATIVE

## 2017-04-03 MED ORDER — ASPIRIN EC 81 MG PO TBEC: DELAYED_RELEASE_TABLET | ORAL | 6 refills | Status: DC

## 2017-04-03 NOTE — Progress Notes (Signed)
[id number] 8162w3d Estimated Date of Delivery: 08/25/17  Blood pressure 102/60, pulse 70, weight 126 lb (57.2 kg), last menstrual period 11/18/2016.   BP weight and urine results all reviewed and noted.  Please refer to the obstetrical flow sheet for the fundal height and fetal heart rate documentation:  Patient reports good fetal movement, denies any bleeding and no rupture of membranes symptoms or regular contractions. Patient is without complaints. All questions were answered.  Orders Placed This Encounter  Procedures  . POCT Urinalysis Dipstick    Plan:  Continued routine obstetrical care, increase to 2 aspirin daily  Return in about 4 weeks (around 05/01/2017) for LROB.

## 2017-04-03 NOTE — Progress Notes (Signed)
US 19+3 wks,cephalic,cx 4 cm,anterior pl gr 1,normal ovaries bilat,svp of fluid 4.5 cm,fhr 159 bpm,LVEICF,EFW 272 g,25%,anatomy complete

## 2017-05-01 ENCOUNTER — Encounter: Payer: Self-pay | Admitting: Women's Health

## 2017-05-01 ENCOUNTER — Ambulatory Visit (INDEPENDENT_AMBULATORY_CARE_PROVIDER_SITE_OTHER): Payer: BLUE CROSS/BLUE SHIELD

## 2017-05-01 ENCOUNTER — Other Ambulatory Visit: Payer: BLUE CROSS/BLUE SHIELD

## 2017-05-01 ENCOUNTER — Ambulatory Visit (INDEPENDENT_AMBULATORY_CARE_PROVIDER_SITE_OTHER): Payer: BLUE CROSS/BLUE SHIELD | Admitting: Women's Health

## 2017-05-01 VITALS — BP 120/70 | HR 97 | Wt 135.0 lb

## 2017-05-01 DIAGNOSIS — O2341 Unspecified infection of urinary tract in pregnancy, first trimester: Secondary | ICD-10-CM | POA: Diagnosis not present

## 2017-05-01 DIAGNOSIS — Z3A23 23 weeks gestation of pregnancy: Secondary | ICD-10-CM | POA: Diagnosis not present

## 2017-05-01 DIAGNOSIS — O09899 Supervision of other high risk pregnancies, unspecified trimester: Secondary | ICD-10-CM

## 2017-05-01 DIAGNOSIS — Z331 Pregnant state, incidental: Secondary | ICD-10-CM

## 2017-05-01 DIAGNOSIS — O26842 Uterine size-date discrepancy, second trimester: Secondary | ICD-10-CM

## 2017-05-01 DIAGNOSIS — O09219 Supervision of pregnancy with history of pre-term labor, unspecified trimester: Secondary | ICD-10-CM

## 2017-05-01 DIAGNOSIS — Z8759 Personal history of other complications of pregnancy, childbirth and the puerperium: Secondary | ICD-10-CM

## 2017-05-01 DIAGNOSIS — O09212 Supervision of pregnancy with history of pre-term labor, second trimester: Secondary | ICD-10-CM

## 2017-05-01 DIAGNOSIS — O34219 Maternal care for unspecified type scar from previous cesarean delivery: Secondary | ICD-10-CM

## 2017-05-01 DIAGNOSIS — R51 Headache: Secondary | ICD-10-CM

## 2017-05-01 DIAGNOSIS — O09292 Supervision of pregnancy with other poor reproductive or obstetric history, second trimester: Secondary | ICD-10-CM

## 2017-05-01 DIAGNOSIS — Z3482 Encounter for supervision of other normal pregnancy, second trimester: Secondary | ICD-10-CM

## 2017-05-01 DIAGNOSIS — O9989 Other specified diseases and conditions complicating pregnancy, childbirth and the puerperium: Secondary | ICD-10-CM

## 2017-05-01 DIAGNOSIS — Z98891 History of uterine scar from previous surgery: Secondary | ICD-10-CM | POA: Insufficient documentation

## 2017-05-01 DIAGNOSIS — N390 Urinary tract infection, site not specified: Secondary | ICD-10-CM

## 2017-05-01 DIAGNOSIS — Z1389 Encounter for screening for other disorder: Secondary | ICD-10-CM

## 2017-05-01 DIAGNOSIS — O26843 Uterine size-date discrepancy, third trimester: Secondary | ICD-10-CM

## 2017-05-01 DIAGNOSIS — O2342 Unspecified infection of urinary tract in pregnancy, second trimester: Secondary | ICD-10-CM

## 2017-05-01 LAB — POCT URINALYSIS DIPSTICK
Glucose, UA: NEGATIVE
Ketones, UA: NEGATIVE
LEUKOCYTES UA: NEGATIVE
NITRITE UA: NEGATIVE
PROTEIN UA: NEGATIVE
RBC UA: NEGATIVE

## 2017-05-01 NOTE — Progress Notes (Signed)
LOW-RISK PREGNANCY VISIT Patient name: Kimberly Foster MRN [id number]  Date of birth: 07-Apr-1990 Chief Complaint:   Routine Prenatal Visit (headaches)  History of Present Illness:   Kimberly Foster is a 27 y.o. G60P0101 female at [redacted]w[redacted]d with an Estimated Date of Delivery: 08/25/17 being seen today for ongoing management of a low-risk pregnancy.  Today she reports headaches, 'I've always had them', takes Fioricet prn, warm showers, sleep etc. Doesn't really help much. Interested in BTL. H/O C/S for severe pre-e @ 31wks, taking baby asa 162mg  as directed.  .  .  Movement: Present. denies leaking of fluid. Review of Systems:   Pertinent items are noted in HPI Denies abnormal vaginal discharge w/ itching/odor/irritation, headaches, visual changes, shortness of breath, chest pain, abdominal pain, severe nausea/vomiting, or problems with urination or bowel movements unless otherwise stated above. Pertinent History Reviewed:  Reviewed past medical,surgical, social, obstetrical and family history.  Reviewed problem list, medications and allergies. Physical Assessment:   Vitals:   05/01/17 1043  BP: 120/70  Pulse: 97  Weight: 135 lb (61.2 kg)  Body mass index is 25.51 kg/m.        Physical Examination:   General appearance: Well appearing, and in no distress  Mental status: Alert, oriented to person, place, and time  Skin: Warm & dry  Cardiovascular: Normal heart rate noted  Respiratory: Normal respiratory effort, no distress  Abdomen: Soft, gravid, nontender  Pelvic: Cervical exam deferred         Extremities: Edema: None  Fetal Status: Fetal Heart Rate (bpm): 156 Fundal Height: 20 cm Movement: Present    Results for orders placed or performed in visit on 05/01/17 (from the past 24 hour(s))  POCT urinalysis dipstick   Collection Time: 05/01/17 10:50 AM  Result Value Ref Range   Color, UA     Clarity, UA     Glucose, UA neg    Bilirubin, UA     Ketones, UA neg    Spec Grav, UA   1.010 - 1.025   Blood, UA neg    pH, UA  5.0 - 8.0   Protein, UA neg    Urobilinogen, UA  0.2 or 1.0 E.U./dL   Nitrite, UA neg    Leukocytes, UA Negative Negative   Appearance     Odor      Assessment & Plan:  1) Low-risk pregnancy G2P0101 at [redacted]w[redacted]d with an Estimated Date of Delivery: 08/25/17   2) H/O severe pre-e, continue baby asa 162mg , will get baseline P:C ratio today, add CMP to PN2 next visit  3) Prev c/s> wants RCS, records requested  4) Headaches> gave printed prevention/relief measures, if worsening from her normal let us know  5) Uterine size < dates, will get efw/afi u/s  6) UTI first trimester> POC urine cx today  7) Thinking about BTL> discussed high incidence of regret <30yo, LARCs just as effective, wants to think about IUD, will let us know what she decides   Meds: No orders of the defined types were placed in this encounter.  Labs/procedures today: p:c ratio, urine cx  Plan:  Continue routine obstetrical care   Reviewed: Preterm labor symptoms and general obstetric precautions including but not limited to vaginal bleeding, contractions, leaking of fluid and fetal movement were reviewed in detail with the patient.  All questions were answered  Follow-up: Return for asap efw/afi u/s (no visit), then 4wks for LROB/PN2, sign release for op note from [place].  Orders Placed This Encounter  Procedures  .  Urine Culture  . US OB Follow Up  . Protein / creatinine ratio, urine  . POCT urinalysis dipstick   Cheral MarkerKimberly R Safiya Girdler CNM, [facility]NP-BC 05/01/2017 11:19 AM

## 2017-05-01 NOTE — Patient Instructions (Addendum)
Theodoro KosHeather Florek, I greatly value your feedback.  If you receive a survey following your visit with us today, we appreciate you taking the time to fill it out.  Thanks, Joellyn HaffKim Booker, CNM, WHNP-BC   You will have your sugar test next visit.  Please do not eat or drink anything after midnight the night before you come, not even water.  You will be here for at least two hours.     Call the office [phone]) or go to [facility] if:  You begin to have strong, frequent contractions  Your water breaks.  Sometimes it is a big gush of fluid, sometimes it is just a trickle that keeps getting your panties wet or running down your legs  You have vaginal bleeding.  It is normal to have a small amount of spotting if your cervix was checked.   You don't feel your baby moving like normal.  If you don't, get you something to eat and drink and lay down and focus on feeling your baby move.   If your baby is still not moving like normal, you should call the office or go to [facility].  For Headaches:   Stay well hydrated, drink enough water so that your urine is clear, sometimes if you are dehydrated you can get headaches  Eat small frequent meals and snacks, sometimes if you are hungry you can get headaches  Sometimes you get headaches during pregnancy from the pregnancy hormones  You can try tylenol (1-2 regular strength 325mg  or 1-2 extra strength 500mg ) as directed on the box. The least amount of medication that works is best.   Cool compresses (cool wet washcloth or ice pack) to area of head that is hurting  You can also try drinking a caffeinated drink to see if this will help  If not helping, try below:  For Prevention of Headaches/Migraines:  CoQ10 100mg  three times daily  Vitamin B2 400mg  daily  Magnesium Oxide 400-600mg  daily  If You Get a Bad Headache/Migraine:  Benadryl 25mg    Magnesium Oxide  1 large Gatorade  2 extra strength Tylenol (1,000mg  total)  1 cup coffee  or Coke  If this doesn't help please call us @ [phone]    Second Trimester of Pregnancy The second trimester is from week 13 through week 28, months 4 through 6. The second trimester is often a time when you feel your best. Your body has also adjusted to being pregnant, and you begin to feel better physically. Usually, morning sickness has lessened or quit completely, you may have more energy, and you may have an increase in appetite. The second trimester is also a time when the fetus is growing rapidly. At the end of the sixth month, the fetus is about 9 inches long and weighs about 1 pounds. You will likely begin to feel the baby move (quickening) between 18 and 20 weeks of the pregnancy. BODY CHANGES Your body goes through many changes during pregnancy. The changes vary from woman to woman.   Your weight will continue to increase. You will notice your lower abdomen bulging out.  You may begin to get stretch marks on your hips, abdomen, and breasts.  You may develop headaches that can be relieved by medicines approved by your health care provider.  You may urinate more often because the fetus is pressing on your bladder.  You may develop or continue to have heartburn as a result of your pregnancy.  You may develop constipation because certain hormones  are causing the muscles that push waste through your intestines to slow down.  You may develop hemorrhoids or swollen, bulging veins (varicose veins).  You may have back pain because of the weight gain and pregnancy hormones relaxing your joints between the bones in your pelvis and as a result of a shift in weight and the muscles that support your balance.  Your breasts will continue to grow and be tender.  Your gums may bleed and may be sensitive to brushing and flossing.  Dark spots or blotches (chloasma, mask of pregnancy) may develop on your face. This will likely fade after the baby is born.  A dark line from your belly  button to the pubic area (linea nigra) may appear. This will likely fade after the baby is born.  You may have changes in your hair. These can include thickening of your hair, rapid growth, and changes in texture. Some women also have hair loss during or after pregnancy, or hair that feels dry or thin. Your hair will most likely return to normal after your baby is born. WHAT TO EXPECT AT YOUR PRENATAL VISITS During a routine prenatal visit:  You will be weighed to make sure you and the fetus are growing normally.  Your blood pressure will be taken.  Your abdomen will be measured to track your baby's growth.  The fetal heartbeat will be listened to.  Any test results from the previous visit will be discussed. Your health care provider may ask you:  How you are feeling.  If you are feeling the baby move.  If you have had any abnormal symptoms, such as leaking fluid, bleeding, severe headaches, or abdominal cramping.  If you have any questions. Other tests that may be performed during your second trimester include:  Blood tests that check for:  Low iron levels (anemia).  Gestational diabetes (between 24 and 28 weeks).  Rh antibodies.  Urine tests to check for infections, diabetes, or protein in the urine.  An ultrasound to confirm the proper growth and development of the baby.  An amniocentesis to check for possible genetic problems.  Fetal screens for spina bifida and Down syndrome. HOME CARE INSTRUCTIONS   Avoid all smoking, herbs, alcohol, and unprescribed drugs. These chemicals affect the formation and growth of the baby.  Follow your health care provider's instructions regarding medicine use. There are medicines that are either safe or unsafe to take during pregnancy.  Exercise only as directed by your health care provider. Experiencing uterine cramps is a good sign to stop exercising.  Continue to eat regular, healthy meals.  Wear a good support bra for breast  tenderness.  Do not use hot tubs, steam rooms, or saunas.  Wear your seat belt at all times when driving.  Avoid raw meat, uncooked cheese, cat litter boxes, and soil used by cats. These carry germs that can cause birth defects in the baby.  Take your prenatal vitamins.  Try taking a stool softener (if your health care provider approves) if you develop constipation. Eat more high-fiber foods, such as fresh vegetables or fruit and whole grains. Drink plenty of fluids to keep your urine clear or pale yellow.  Take warm sitz baths to soothe any pain or discomfort caused by hemorrhoids. Use hemorrhoid cream if your health care provider approves.  If you develop varicose veins, wear support hose. Elevate your feet for 15 minutes, 3-4 times a day. Limit salt in your diet.  Avoid heavy lifting, wear low heel shoes,  and practice good posture.  Rest with your legs elevated if you have leg cramps or low back pain.  Visit your dentist if you have not gone yet during your pregnancy. Use a soft toothbrush to brush your teeth and be gentle when you floss.  A sexual relationship may be continued unless your health care provider directs you otherwise.  Continue to go to all your prenatal visits as directed by your health care provider. SEEK MEDICAL CARE IF:   You have dizziness.  You have mild pelvic cramps, pelvic pressure, or nagging pain in the abdominal area.  You have persistent nausea, vomiting, or diarrhea.  You have a bad smelling vaginal discharge.  You have pain with urination. SEEK IMMEDIATE MEDICAL CARE IF:   You have a fever.  You are leaking fluid from your vagina.  You have spotting or bleeding from your vagina.  You have severe abdominal cramping or pain.  You have rapid weight gain or loss.  You have shortness of breath with chest pain.  You notice sudden or extreme swelling of your face, hands, ankles, feet, or legs.  You have not felt your baby move in over an  hour.  You have severe headaches that do not go away with medicine.  You have vision changes. Document Released: 01/01/2001 Document Revised: 01/12/2013 Document Reviewed: 03/10/2012 [facility] Patient Information 2015 [place]. This information is not intended to replace advice given to you by your health care provider. Make sure you discuss any questions you have with your health care provider.

## 2017-05-01 NOTE — Progress Notes (Signed)
US 23+3 wks,cephalic,anterior pl gr 1,svp of fluid 5.3 cm,fhr 154 bpm,LVEICF,CX 3 cm,efw 520 g 13%

## 2017-05-02 ENCOUNTER — Encounter: Payer: Self-pay | Admitting: Women's Health

## 2017-05-02 LAB — PROTEIN / CREATININE RATIO, URINE
CREATININE, UR: 19.1 mg/dL
Protein, Ur: 5.3 mg/dL
Protein/Creat Ratio: 277 mg/g creat — ABNORMAL HIGH (ref 0–200)

## 2017-05-03 LAB — URINE CULTURE

## 2017-05-08 ENCOUNTER — Encounter: Payer: Self-pay | Admitting: Women's Health

## 2017-05-19 ENCOUNTER — Encounter (HOSPITAL_COMMUNITY): Payer: Self-pay

## 2017-05-19 ENCOUNTER — Inpatient Hospital Stay (HOSPITAL_COMMUNITY)
Admission: AD | Admit: 2017-05-19 | Discharge: 2017-06-04 | DRG: 833 | Disposition: A | Discharge status: Home / Self Care | Payer: BLUE CROSS/BLUE SHIELD | Source: Ambulatory Visit | Attending: Obstetrics and Gynecology | Admitting: Obstetrics and Gynecology

## 2017-05-19 ENCOUNTER — Inpatient Hospital Stay (HOSPITAL_COMMUNITY): Payer: BLUE CROSS/BLUE SHIELD

## 2017-05-19 DIAGNOSIS — O4592 Premature separation of placenta, unspecified, second trimester: Secondary | ICD-10-CM | POA: Diagnosis not present

## 2017-05-19 DIAGNOSIS — O4290 Premature rupture of membranes, unspecified as to length of time between rupture and onset of labor, unspecified weeks of gestation: Secondary | ICD-10-CM

## 2017-05-19 DIAGNOSIS — O42912 Preterm premature rupture of membranes, unspecified as to length of time between rupture and onset of labor, second trimester: Secondary | ICD-10-CM | POA: Diagnosis not present

## 2017-05-19 DIAGNOSIS — O4693 Antepartum hemorrhage, unspecified, third trimester: Secondary | ICD-10-CM | POA: Diagnosis not present

## 2017-05-19 DIAGNOSIS — O4690 Antepartum hemorrhage, unspecified, unspecified trimester: Secondary | ICD-10-CM | POA: Diagnosis not present

## 2017-05-19 DIAGNOSIS — O99332 Smoking (tobacco) complicating pregnancy, second trimester: Secondary | ICD-10-CM | POA: Diagnosis not present

## 2017-05-19 DIAGNOSIS — O4692 Antepartum hemorrhage, unspecified, second trimester: Secondary | ICD-10-CM | POA: Diagnosis not present

## 2017-05-19 DIAGNOSIS — Z98891 History of uterine scar from previous surgery: Secondary | ICD-10-CM

## 2017-05-19 DIAGNOSIS — Z7982 Long term (current) use of aspirin: Secondary | ICD-10-CM | POA: Diagnosis not present

## 2017-05-19 DIAGNOSIS — F17210 Nicotine dependence, cigarettes, uncomplicated: Secondary | ICD-10-CM | POA: Diagnosis present

## 2017-05-19 DIAGNOSIS — O0990 Supervision of high risk pregnancy, unspecified, unspecified trimester: Secondary | ICD-10-CM

## 2017-05-19 DIAGNOSIS — R001 Bradycardia, unspecified: Secondary | ICD-10-CM | POA: Clinically undetermined

## 2017-05-19 DIAGNOSIS — O34211 Maternal care for low transverse scar from previous cesarean delivery: Secondary | ICD-10-CM | POA: Diagnosis not present

## 2017-05-19 DIAGNOSIS — Z3A28 28 weeks gestation of pregnancy: Secondary | ICD-10-CM | POA: Diagnosis not present

## 2017-05-19 DIAGNOSIS — O34219 Maternal care for unspecified type scar from previous cesarean delivery: Secondary | ICD-10-CM | POA: Diagnosis not present

## 2017-05-19 DIAGNOSIS — O26893 Other specified pregnancy related conditions, third trimester: Secondary | ICD-10-CM | POA: Diagnosis present

## 2017-05-19 DIAGNOSIS — Z3A26 26 weeks gestation of pregnancy: Secondary | ICD-10-CM

## 2017-05-19 DIAGNOSIS — O0992 Supervision of high risk pregnancy, unspecified, second trimester: Secondary | ICD-10-CM | POA: Diagnosis not present

## 2017-05-19 DIAGNOSIS — O42919 Preterm premature rupture of membranes, unspecified as to length of time between rupture and onset of labor, unspecified trimester: Secondary | ICD-10-CM | POA: Clinically undetermined

## 2017-05-19 DIAGNOSIS — O321XX0 Maternal care for breech presentation, not applicable or unspecified: Secondary | ICD-10-CM | POA: Diagnosis present

## 2017-05-19 DIAGNOSIS — O09299 Supervision of pregnancy with other poor reproductive or obstetric history, unspecified trimester: Secondary | ICD-10-CM

## 2017-05-19 DIAGNOSIS — O09219 Supervision of pregnancy with history of pre-term labor, unspecified trimester: Secondary | ICD-10-CM

## 2017-05-19 DIAGNOSIS — O329XX0 Maternal care for malpresentation of fetus, unspecified, not applicable or unspecified: Secondary | ICD-10-CM | POA: Diagnosis not present

## 2017-05-19 DIAGNOSIS — O4292 Full-term premature rupture of membranes, unspecified as to length of time between rupture and onset of labor: Secondary | ICD-10-CM | POA: Diagnosis not present

## 2017-05-19 DIAGNOSIS — O42913 Preterm premature rupture of membranes, unspecified as to length of time between rupture and onset of labor, third trimester: Secondary | ICD-10-CM | POA: Diagnosis not present

## 2017-05-19 DIAGNOSIS — O09292 Supervision of pregnancy with other poor reproductive or obstetric history, second trimester: Secondary | ICD-10-CM | POA: Diagnosis not present

## 2017-05-19 DIAGNOSIS — F17200 Nicotine dependence, unspecified, uncomplicated: Secondary | ICD-10-CM | POA: Diagnosis present

## 2017-05-19 DIAGNOSIS — O09899 Supervision of other high risk pregnancies, unspecified trimester: Secondary | ICD-10-CM

## 2017-05-19 DIAGNOSIS — Z3A27 27 weeks gestation of pregnancy: Secondary | ICD-10-CM | POA: Diagnosis not present

## 2017-05-19 LAB — CBC
HCT: 30.4 % — ABNORMAL LOW (ref 36.0–46.0)
HEMATOCRIT: 33 % — AB (ref 36.0–46.0)
HEMATOCRIT: 31.4 % — AB (ref 36.0–46.0)
HEMOGLOBIN: 10.8 g/dL — AB (ref 12.0–15.0)
Hemoglobin: 10.6 g/dL — ABNORMAL LOW (ref 12.0–15.0)
Hemoglobin: 11.4 g/dL — ABNORMAL LOW (ref 12.0–15.0)
MCH: 31 pg (ref 26.0–34.0)
MCH: 30.8 pg (ref 26.0–34.0)
MCH: 30.9 pg (ref 26.0–34.0)
MCHC: 34.9 g/dL (ref 30.0–36.0)
MCHC: 34.5 g/dL (ref 30.0–36.0)
MCHC: 34.4 g/dL (ref 30.0–36.0)
MCV: 88.9 fL (ref 78.0–100.0)
MCV: 89.2 fL (ref 78.0–100.0)
MCV: 89.7 fL (ref 78.0–100.0)
PLATELETS: 222 10*3/uL (ref 150–400)
PLATELETS: 244 10*3/uL (ref 150–400)
Platelets: 241 10*3/uL (ref 150–400)
RBC: 3.42 MIL/uL — ABNORMAL LOW (ref 3.87–5.11)
RBC: 3.7 MIL/uL — AB (ref 3.87–5.11)
RBC: 3.5 MIL/uL — ABNORMAL LOW (ref 3.87–5.11)
RDW: 12.6 % (ref 11.5–15.5)
RDW: 12.7 % (ref 11.5–15.5)
RDW: 12.5 % (ref 11.5–15.5)
WBC: 19.4 10*3/uL — AB (ref 4.0–10.5)
WBC: 22.5 10*3/uL — AB (ref 4.0–10.5)
WBC: 21.8 10*3/uL — ABNORMAL HIGH (ref 4.0–10.5)

## 2017-05-19 LAB — URINALYSIS, ROUTINE W REFLEX MICROSCOPIC

## 2017-05-19 LAB — URINALYSIS, MICROSCOPIC (REFLEX): RBC / HPF: >50 RBC/hpf (ref 0–5)

## 2017-05-19 LAB — AMNISURE RUPTURE OF MEMBRANE (ROM) NOT AT ARMC: Amnisure ROM: POSITIVE

## 2017-05-19 LAB — ABO/RH: ABO/RH(D): A POS

## 2017-05-19 LAB — TYPE AND SCREEN
ABO/RH(D): A POS
ANTIBODY SCREEN: NEGATIVE

## 2017-05-19 LAB — KLEIHAUER-BETKE STAIN
# Vials RhIg: 1
Fetal Cells %: 0 %
QUANTITATION FETAL HEMOGLOBIN: 0 mL

## 2017-05-19 MED ORDER — ONDANSETRON HCL 4 MG/2ML IJ SOLN
4.0000 mg | Freq: Four times a day (QID) | INTRAMUSCULAR | Status: DC | PRN
  Filled 2017-05-19: qty 2

## 2017-05-19 MED ORDER — OXYCODONE-ACETAMINOPHEN 5-325 MG PO TABS: 1.0000 | ORAL_TABLET | ORAL | Status: DC | PRN

## 2017-05-19 MED ORDER — AZITHROMYCIN 250 MG PO TABS
500.0000 mg | ORAL_TABLET | Freq: Every day | ORAL | Status: AC
  Administered 2017-05-20 – 2017-05-25 (×6): 500 mg via ORAL
  Filled 2017-05-19 (×3): qty 2
  Filled 2017-05-19: qty 1
  Filled 2017-05-19: qty 2
  Filled 2017-05-19: qty 1
  Filled 2017-05-19 (×2): qty 2

## 2017-05-19 MED ORDER — LIDOCAINE HCL (PF) 1 % IJ SOLN
30.0000 mL | INTRAMUSCULAR | Status: DC | PRN
  Filled 2017-05-19: qty 30

## 2017-05-19 MED ORDER — SOD CITRATE-CITRIC ACID 500-334 MG/5ML PO SOLN
30.0000 mL | ORAL | Status: DC | PRN
  Administered 2017-05-20 (×2): 30 mL via ORAL
  Filled 2017-05-19 (×2): qty 15

## 2017-05-19 MED ORDER — ACETAMINOPHEN 325 MG PO TABS: 650.0000 mg | ORAL_TABLET | ORAL | Status: DC | PRN

## 2017-05-19 MED ORDER — OXYTOCIN 40 UNITS IN LACTATED RINGERS INFUSION - SIMPLE MED: 2.5000 [IU]/h | INTRAVENOUS | Status: DC

## 2017-05-19 MED ORDER — PRENATAL MULTIVITAMIN CH
1.0000 | ORAL_TABLET | Freq: Every day | ORAL | Status: DC
  Filled 2017-05-19: qty 1

## 2017-05-19 MED ORDER — AMOXICILLIN 500 MG PO CAPS
500.0000 mg | ORAL_CAPSULE | Freq: Three times a day (TID) | ORAL | Status: AC
  Administered 2017-05-21 – 2017-05-26 (×15): 500 mg via ORAL
  Filled 2017-05-19 (×15): qty 1

## 2017-05-19 MED ORDER — LACTATED RINGERS IV SOLN
INTRAVENOUS | Status: DC
  Administered 2017-05-19 – 2017-05-20 (×3): via INTRAVENOUS

## 2017-05-19 MED ORDER — MAGNESIUM SULFATE 40 G IN LACTATED RINGERS - SIMPLE
2.0000 g/h | INTRAVENOUS | Status: DC
  Administered 2017-05-20: 2 g/h via INTRAVENOUS
  Filled 2017-05-19: qty 500
  Filled 2017-05-19: qty 40

## 2017-05-19 MED ORDER — BETAMETHASONE SOD PHOS & ACET 6 (3-3) MG/ML IJ SUSP
12.0000 mg | INTRAMUSCULAR | Status: AC
  Administered 2017-05-19 – 2017-05-20 (×2): 12 mg via INTRAMUSCULAR
  Filled 2017-05-19 (×2): qty 2

## 2017-05-19 MED ORDER — OXYCODONE-ACETAMINOPHEN 5-325 MG PO TABS: 2.0000 | ORAL_TABLET | ORAL | Status: DC | PRN

## 2017-05-19 MED ORDER — SODIUM CHLORIDE 0.9 % IV SOLN
2.0000 g | Freq: Four times a day (QID) | INTRAVENOUS | Status: AC
  Administered 2017-05-19 – 2017-05-21 (×8): 2 g via INTRAVENOUS
  Filled 2017-05-19: qty 2000
  Filled 2017-05-19 (×3): qty 2
  Filled 2017-05-19: qty 2000
  Filled 2017-05-19 (×4): qty 2

## 2017-05-19 MED ORDER — MAGNESIUM SULFATE BOLUS VIA INFUSION
4.0000 g | Freq: Once | INTRAVENOUS | Status: AC
  Administered 2017-05-19: 4 g via INTRAVENOUS
  Filled 2017-05-19: qty 500

## 2017-05-19 MED ORDER — OXYTOCIN BOLUS FROM INFUSION: 500.0000 mL | Freq: Once | INTRAVENOUS | Status: DC

## 2017-05-19 MED ORDER — LACTATED RINGERS IV SOLN: 500.0000 mL | INTRAVENOUS | Status: DC | PRN

## 2017-05-19 MED ORDER — BUTALBITAL-APAP-CAFFEINE 50-325-40 MG PO TABS
1.0000 | ORAL_TABLET | Freq: Three times a day (TID) | ORAL | Status: DC | PRN
  Administered 2017-05-19 – 2017-05-20 (×2): 1 via ORAL
  Filled 2017-05-19 (×2): qty 1

## 2017-05-19 NOTE — Progress Notes (Signed)
L&D Note  05/19/2017 - 5:06 PM  27 y.o. [id number] [redacted]w[redacted]d. Pregnancy complicated by h/o PTB at 31wks via pLTCS for mild pre-x but NRFHTs, tobacco abuse  Ms. Theodoro Kos is admitted for abruption   Subjective:  No s/s of PTL or VB. Pt hasn't gotten up to bathroom since last seen by me so unsure if anything on pad  Objective:    Current Vital Signs 24h Vital Sign Ranges  T 98.8 F (37.1 C) Temp  Avg: 98.6 F (37 C)  Min: 98.3 F (36.8 C)  Max: 98.8 F (37.1 C)  BP 114/75 BP  Min: 114/75  Max: 137/82  HR 73 Pulse  Avg: 68.8  Min: 56  Max: 84  RR 18 Resp  Avg: 17.9  Min: 16  Max: 20  SaO2     No data recorded       24 Hour I/O Current Shift I/O  Time Ins Outs No intake/output data recorded. 04/29 0701 - 04/29 1900 In: 802.5 [I.V.:802.5] Out: 1300 [Urine:1300]   FHR: 145 baseline, no accel, no decel, mod variabiltiy Toco: quiet Gen: NAD SVE: deferred.  SSE: scant old blood in vault. Cervix visually closed and no bleeding or oozing from cervix, cervix nttp Scant old blood on pad pt was wearing  Labs:  Recent Labs  Lab 05/19/17 0720 05/19/17 1153 05/19/17 1605  WBC 19.4* 22.5* 21.8*  HGB 10.6* 11.4* 10.8*  HCT 30.4* 33.0* 31.4*  PLT 222 244 241   Conflict (See Lab Report): A POS/A POS Performed at [facility], [address], [place], [place] [address]  KB negative  Medications Current Facility-Administered Medications  Medication Dose Route Frequency Provider Last Rate Last Dose  . acetaminophen (TYLENOL) tablet 650 mg  650 mg Oral Q4H PRN Donette Larry, CNM      . ampicillin (OMNIPEN) 2 g in sodium chloride 0.9 % 100 mL IVPB  2 g Intravenous Q6H [place] Bing, MD   Stopped at 05/19/17 1411   Followed by  . [START ON 05/21/2017] amoxicillin (AMOXIL) capsule 500 mg  500 mg Oral Q8H Jenipher Havel, Billey Gosling, MD      . azithromycin (ZITHROMAX) tablet 500 mg  500 mg Oral Daily [place] Bing, MD      . betamethasone acetate-betamethasone sodium phosphate  (CELESTONE) injection 12 mg  12 mg Intramuscular Q24 Hr x 2 Bhambri, Melanie, CNM   12 mg at 05/19/17 0908  . lactated ringers infusion 500-1,000 mL  500-1,000 mL Intravenous PRN Donette Larry, CNM      . lactated ringers infusion   Intravenous Continuous Donette Larry, CNM 75 mL/hr at 05/19/17 0945    . lidocaine (PF) (XYLOCAINE) 1 % injection 30 mL  30 mL Subcutaneous PRN Bhambri, Melanie, CNM      . magnesium sulfate 40 grams in LR 500 mL OB infusion  2 g/hr Intravenous Titrated Donette Larry, CNM 25 mL/hr at 05/19/17 1030 2 g/hr at 05/19/17 1030  . ondansetron (ZOFRAN) injection 4 mg  4 mg Intravenous Q6H PRN Donette Larry, CNM      . oxytocin (PITOCIN) IV BOLUS FROM BAG  500 mL Intravenous Once Donette Larry, CNM      . oxytocin (PITOCIN) IV infusion 40 units in LR 1000 mL - Premix  2.5 Units/hr Intravenous Continuous Donette Larry, CNM      . sodium citrate-citric acid (ORACIT) solution 30 mL  30 mL Oral Q2H PRN Donette Larry, CNM        Assessment & Plan:  Pt stable,  with abruption and ?PPROM. *IUP: category I fetal status reassuring *Abruption: VB scant to none now and CBC stable. Okay to move to AP. Will continue Mg until 2nd dose of BMZ tomorrow since situation has improved while on it and in case uterus was having some irritability either causing the bleeding or from the bleeding. See below *Preterm: BMZ #2 due at 0900 tomorrow. D/w NICU Dr. Algernon Huxley and he is okay with keeping in house for now. If pt changes status and looks like moving towards delivery I will touch base with him and see if pt needs transfer out.  *?PPROM: still unsure if pt has pprom'ed. Negative spec exam for now. Continue latency abx D#1 and can reswab in a few days.  *GBS: unknown. On latency abx *Prior c-section: fetus is currently breech. Had pLTCS in the past. Would need another if still breech.  *FEN/GI: okay for regular diet, continue MIVF *PPx: SCDs, BR with bathroom privileges   *Analgesia: no current needs.   Cornelia Copa MD Attending Center for [facility])

## 2017-05-19 NOTE — Progress Notes (Addendum)
L&D Note  05/19/2017 - 1:37 PM  27 y.o. [id number] [redacted]w[redacted]d. Pregnancy complicated by h/o PTB at 31wks via pLTCS for mild pre-x but NRFHTs, tobacco abuse  Ms. Kimberly Foster is admitted for abruption   Subjective:  Comfortable and ?feels occasional tightening. Unsure if bleeding is worse   Objective:    Current Vital Signs 24h Vital Sign Ranges  T 98.8 F (37.1 C) Temp  Avg: 98.6 F (37 C)  Min: 98.3 F (36.8 C)  Max: 98.8 F (37.1 C)  BP 127/81 BP  Min: 118/75  Max: 137/82  HR 68 Pulse  Avg: 67.6  Min: 56  Max: 84  RR 18 Resp  Avg: 17.9  Min: 16  Max: 20  SaO2     No data recorded       24 Hour I/O Current Shift I/O  Time Ins Outs No intake/output data recorded. 04/29 0701 - 04/29 1900 In: 402.5 [I.V.:402.5] Out: 450 [Urine:450]   FHR: 150 baseline, ?rare accels, no decel, mod variabiltiy Toco: quiet Gen: NAD SVE: deferred. Old pad in bathroom seen and about 15mL old blood on it and looks better compared to what was in MAU.   Labs:  Amnisure: + Recent Labs  Lab 05/19/17 0720 05/19/17 1153  WBC 19.4* 22.5*  HGB 10.6* 11.4*  HCT 30.4* 33.0*  PLT 222 244   Conflict (See Lab Report): A POS/A POS Performed at [facility], [address] [place], [place] [address]  KB negative  Medications Current Facility-Administered Medications  Medication Dose Route Frequency Provider Last Rate Last Dose  . acetaminophen (TYLENOL) tablet 650 mg  650 mg Oral Q4H PRN Donette Larry, CNM      . ampicillin (OMNIPEN) 2 g in sodium chloride 0.9 % 100 mL IVPB  2 g Intravenous Q6H [place] Bing, MD       Followed by  . [START ON 05/21/2017] amoxicillin (AMOXIL) capsule 500 mg  500 mg Oral Q8H Anijah Spohr, Billey Gosling, MD      . azithromycin (ZITHROMAX) tablet 500 mg  500 mg Oral Daily [place] Bing, MD      . betamethasone acetate-betamethasone sodium phosphate (CELESTONE) injection 12 mg  12 mg Intramuscular Q24 Hr x 2 Bhambri, Melanie, CNM   12 mg at 05/19/17 0908  . lactated  ringers infusion 500-1,000 mL  500-1,000 mL Intravenous PRN Donette Larry, CNM      . lactated ringers infusion   Intravenous Continuous Donette Larry, CNM 75 mL/hr at 05/19/17 0945    . lidocaine (PF) (XYLOCAINE) 1 % injection 30 mL  30 mL Subcutaneous PRN Bhambri, Melanie, CNM      . magnesium sulfate 40 grams in LR 500 mL OB infusion  2 g/hr Intravenous Titrated Donette Larry, CNM 25 mL/hr at 05/19/17 1030 2 g/hr at 05/19/17 1030  . ondansetron (ZOFRAN) injection 4 mg  4 mg Intravenous Q6H PRN Donette Larry, CNM      . oxytocin (PITOCIN) IV BOLUS FROM BAG  500 mL Intravenous Once Donette Larry, CNM      . oxytocin (PITOCIN) IV infusion 40 units in LR 1000 mL - Premix  2.5 Units/hr Intravenous Continuous Donette Larry, CNM      . sodium citrate-citric acid (ORACIT) solution 30 mL  30 mL Oral Q2H PRN Donette Larry, CNM        Assessment & Plan:  Pt stable, ?PPROM *IUP: category I *Abruption: CBC stable and VB appears to be lightening. Will get another CBC later this afternoon and if stable  and VB lightening, can consider doing clear liquids.  *Preterm: BMZ #2 due at 0900 tomorrow. Will see how VB and CBC are later this afternoon and if improving, can d/w NICU whether transfer is needed.  *?PPROM: I told her that there is a high chance of false + due to VB with the amnisure but since she's going to be inpatient for some time and better to err on the side of caution, will treat as pprom and start latency abx. I told her we can reswab her once her VB has stopped *GBS: unknown. On latency abx *Prior c-section: fetus is currently breech. Had pLTCS in the past. Would need another if still breech.  *Analgesia: no current needs.   Cornelia Copa MD Attending Center for [facility])

## 2017-05-19 NOTE — H&P (Signed)
OBSTETRIC ADMISSION HISTORY AND PHYSICAL  Kimberly Foster is a 27 y.o. female G69P0101 with IUP at [redacted]w[redacted]d presenting for vaginal bleeding. Reports waking up around 0500 to feeling wet and found that she was bleeding. States it was large amt of blood. Has not put on a pad. Had IC last night. Having some lower abdominal cramping since. Reports +FM. She plans on breast and bottle feeding. She requests LARCs for birth control.  Dating: By LMP --->  Estimated Date of Delivery: 08/25/17  Sono:    , CWD, normal anatomy, vtx presentation, 520g, AFI 5.3cm, anterior placenta  Prenatal History/Complications: -previous PTD at 31 wks for pre-e>LTCS w/vertical ext -smoker -anxiety  Past Medical History: Past Medical History:  Diagnosis Date  . Anxiety   . Migraines   . Pre-eclampsia     Past Surgical History: Past Surgical History:  Procedure Laterality Date  . CESAREAN SECTION      Obstetrical History: OB History    Gravida  2   Para  1   Term  0   Preterm  1   AB      Living  1     SAB      TAB      Ectopic      Multiple      Live Births  1           Social History: Social History   Socioeconomic History  . Marital status: Significant Other    Spouse name: Not on file  . Number of children: Not on file  . Years of education: Not on file  . Highest education level: Not on file  Occupational History  . Not on file  Social Needs  . Financial resource strain: Not on file  . Food insecurity:    Worry: Not on file    Inability: Not on file  . Transportation needs:    Medical: Not on file    Non-medical: Not on file  Tobacco Use  . Smoking status: Current Every Day Smoker    Packs/day: 0.50    Types: Cigarettes  . Smokeless tobacco: Never Used  Substance and Sexual Activity  . Alcohol use: No    Frequency: Never  . Drug use: No    Types: Marijuana    Comment: 4 years ago  . Sexual activity: Yes    Birth control/protection: None  Lifestyle  .  Physical activity:    Days per week: Not on file    Minutes per session: Not on file  . Stress: Not on file  Relationships  . Social connections:    Talks on phone: Not on file    Gets together: Not on file    Attends religious service: Not on file    Active member of club or organization: Not on file    Attends meetings of clubs or organizations: Not on file    Relationship status: Not on file  Other Topics Concern  . Not on file  Social History Narrative  . Not on file    Family History: Family History  Problem Relation Age of Onset  . Mesothelioma Paternal Grandfather   . Hypertension Paternal Grandfather   . COPD Paternal Grandfather   . Leukemia Paternal Grandmother   . Hypertension Maternal Grandmother   . Hypertension Maternal Grandfather   . Heart attack Maternal Grandfather   . Heart failure Maternal Grandfather   . Hypertension Father   . Diverticulitis Father   . Hypertension Mother   .  Cancer Paternal Uncle   . Arrhythmia Maternal Uncle   . Diabetes Paternal Aunt     Allergies: No Known Allergies  Medications Prior to Admission  Medication Sig Dispense Refill Last Dose  . AMOXICILLIN PO Take by mouth 3 (three) times daily.   Not Taking  . aspirin EC 81 MG tablet Take 2 aspirin daily 30 tablet 6 Taking  . butalbital-acetaminophen-caffeine (FIORICET, ESGIC) 50-325-40 MG tablet Take 1 tablet by mouth every 6 (six) hours as needed for headache. 20 tablet 0 Taking  . Prenatal MV-Min-FA-Omega-3 (PRENATAL GUMMIES/DHA & FA) 0.4-32.5 MG CHEW Chew 2 tablets by mouth daily.   Taking     Review of Systems   All systems reviewed and negative except as stated in HPI  Blood pressure 131/88, pulse 76, temperature 98.3 F (36.8 C), resp. rate 17, height  (1.549 m), weight 139 lb 4 oz (63.2 kg), last menstrual period 11/18/2016. General appearance: alert, cooperative and no distress Lungs: regular rate and effort Heart: regular rate  Abdomen: soft,  non-tender Extremities: Homans sign is negative, no sign of DVT Presentation: breech Fetal monitoringBaseline: 155 bpm, Variability: Good {> 6 bpm), Accelerations: Reactive and Decelerations: Absent Uterine activity Frequency: Every 3-4 minutes   NW:GNFAOZHY: lrg amt of blood filling speculum, cleared with fox swabs, slow trickle from os, visually closed   Prenatal labs: ABO, Rh: --/--/A POS (04/29 0720) Antibody: NEG (04/29 0720) Rubella: 1.35 (01/16 1208) RPR: Non Reactive (01/16 1208)  HBsAg: Negative (01/16 1208)  HIV: Non Reactive (01/16 1208)  GBS:   unknown 1 hr Glucola n/a Genetic screening  neg Anatomy US nml  Prenatal Transfer Tool  Maternal Diabetes: n/a Genetic Screening: Normal Maternal Ultrasounds/Referrals: Normal Fetal Ultrasounds or other Referrals:  None Maternal Substance Abuse:  Yes:  Type: Smoker Significant Maternal Medications:  None Significant Maternal Lab Results: None  Results for orders placed or performed during the hospital encounter of 05/19/17 (from the past 24 hour(s))  Urinalysis, Routine w reflex microscopic   Collection Time: 05/19/17  6:43 AM  Result Value Ref Range   Color, Urine RED (A) YELLOW   APPearance TURBID (A) CLEAR   Specific Gravity, Urine  1.005 - 1.030    TEST NOT REPORTED DUE TO COLOR INTERFERENCE OF URINE PIGMENT   pH  5.0 - 8.0    TEST NOT REPORTED DUE TO COLOR INTERFERENCE OF URINE PIGMENT   Glucose, UA (A) NEGATIVE mg/dL    TEST NOT REPORTED DUE TO COLOR INTERFERENCE OF URINE PIGMENT   Hgb urine dipstick (A) NEGATIVE    TEST NOT REPORTED DUE TO COLOR INTERFERENCE OF URINE PIGMENT   Bilirubin Urine (A) NEGATIVE    TEST NOT REPORTED DUE TO COLOR INTERFERENCE OF URINE PIGMENT   Ketones, ur (A) NEGATIVE mg/dL    TEST NOT REPORTED DUE TO COLOR INTERFERENCE OF URINE PIGMENT   Protein, ur (A) NEGATIVE mg/dL    TEST NOT REPORTED DUE TO COLOR INTERFERENCE OF URINE PIGMENT   Nitrite (A) NEGATIVE    TEST NOT REPORTED DUE TO  COLOR INTERFERENCE OF URINE PIGMENT   Leukocytes, UA (A) NEGATIVE    TEST NOT REPORTED DUE TO COLOR INTERFERENCE OF URINE PIGMENT  Urinalysis, Microscopic (reflex)   Collection Time: 05/19/17  6:43 AM  Result Value Ref Range   RBC / HPF >50 0 - 5 RBC/hpf   WBC, UA 6-10 0 - 5 WBC/hpf   Bacteria, UA RARE (A) NONE SEEN   Squamous Epithelial / LPF PRESENT 0 -  5  CBC   Collection Time: 05/19/17  7:20 AM  Result Value Ref Range   WBC 19.4 (H) 4.0 - 10.5 K/uL   RBC 3.42 (L) 3.87 - 5.11 MIL/uL   Hemoglobin 10.6 (L) 12.0 - 15.0 g/dL   HCT 16.1 (L) 09.6 - 04.5 %   MCV 88.9 78.0 - 100.0 fL   MCH 31.0 26.0 - 34.0 pg   MCHC 34.9 30.0 - 36.0 g/dL   RDW 40.9 81.1 - 91.4 %   Platelets 222 150 - 400 K/uL  Type and screen   Collection Time: 05/19/17  7:20 AM  Result Value Ref Range   ABO/RH(D) A POS    Antibody Screen NEG    Sample Expiration      05/22/2017 Performed at [facility], [address], [place], [place] [address]     Patient Active Problem List   Diagnosis Date Noted  . Vaginal bleeding in pregnancy 05/19/2017  . Previous cesarean section 05/01/2017  . UTI (urinary tract infection) during pregnancy, first trimester 05/01/2017  . Supervision of normal pregnancy 02/05/2017  . Smoker 02/05/2017  . Anxiety 01/20/2017  . History of preterm delivery, currently pregnant 01/08/2017  . Hx of preeclampsia, prior pregnancy, currently pregnant 01/08/2017    Assessment: Kimberly Foster is a 27 y.o. G2P0101 at [redacted]w[redacted]d here for vaginal bleeding, evaluation in MAU shows large [facility]   Plan: 1. Admit to BS 2. Magnesium Sulfate 3. BMZ 4. Close observation, would need RCS for delivery  Donette Larry, CNM  05/19/2017, 8:32 AM

## 2017-05-19 NOTE — Progress Notes (Addendum)
MAU Note D/w dr. Eric Form and I don't feel she's stable to transfer and he is amenable to keeping her for now Pt still with no pain Peri pad (has had on since exam at around 0730) with about 25mL blood on it. Fetus category I, no definite UCs seen  Will add on amnisure with caveat that may give false reading for blood. May have to repeat in a few days but if + will treat like pprom and start latency abx. Will repeat cbc in a few hours to trend counts  D/w pt and partner re: plan of care: inpatient, Mg, bmz, npo.  Cornelia Copa MD Attending [facility] [employer] (Faculty Practice) 05/19/2017 Time: [phone]

## 2017-05-19 NOTE — Progress Notes (Signed)
Spec exam by Dr Vergie Living, closed cervix, no new bleeding noted

## 2017-05-19 NOTE — Anesthesia Pain Management Evaluation Note (Signed)
  CRNA Pain Management Visit Note  Patient: Kimberly Foster, 27 y.o., female  "Hello I am a member of the anesthesia team at [facility]. We have an anesthesia team available at all times to provide care throughout the hospital, including epidural management and anesthesia for C-section. I don't know your plan for the delivery whether it a natural birth, water birth, IV sedation, nitrous supplementation, doula or epidural, but we want to meet your pain goals."   1.Was your pain managed to your expectations on prior hospitalizations?   Yes   2.What is your expectation for pain management during this hospitalization?     Spinal for repeat C-section  3.How can we help you reach that goal? Nursing interventions until C/S decided.  Record the patient's initial score and the patient's pain goal.   Pain: 3  Pain Goal: 7 The [facility] wants you to be able to say your pain was always managed very well.  Loucille Takach 05/19/2017

## 2017-05-19 NOTE — MAU Note (Signed)
Woke up at 0530 and was having vaginal bleeding-saw 1 1inch clot.  Also reports abdominal pain that comes and goes.  Reports she is pretty sure she felt baby move this morning.  No placental issues that she knows of.  Reports having sex last night.  Has not taken anything for the pain.

## 2017-05-20 ENCOUNTER — Other Ambulatory Visit: Payer: Self-pay

## 2017-05-20 DIAGNOSIS — O09292 Supervision of pregnancy with other poor reproductive or obstetric history, second trimester: Secondary | ICD-10-CM

## 2017-05-20 DIAGNOSIS — O99332 Smoking (tobacco) complicating pregnancy, second trimester: Secondary | ICD-10-CM

## 2017-05-20 DIAGNOSIS — O4692 Antepartum hemorrhage, unspecified, second trimester: Secondary | ICD-10-CM

## 2017-05-20 DIAGNOSIS — O0992 Supervision of high risk pregnancy, unspecified, second trimester: Secondary | ICD-10-CM

## 2017-05-20 LAB — CULTURE, BETA STREP (GROUP B ONLY)

## 2017-05-20 LAB — RPR: RPR: NONREACTIVE

## 2017-05-20 MED ORDER — COMPLETENATE 29-1 MG PO CHEW
1.0000 | CHEWABLE_TABLET | Freq: Every day | ORAL | Status: DC
  Filled 2017-05-20: qty 1

## 2017-05-20 MED ORDER — CALCIUM CARBONATE ANTACID 500 MG PO CHEW
2.0000 | CHEWABLE_TABLET | ORAL | Status: DC | PRN
Start: 2017-05-20 — End: 2017-06-04
  Administered 2017-05-20 – 2017-05-27 (×9): 400 mg via ORAL
  Filled 2017-05-20 (×9): qty 2

## 2017-05-20 MED ORDER — SODIUM CHLORIDE 0.9% FLUSH
3.0000 mL | Freq: Three times a day (TID) | INTRAVENOUS | Status: DC
  Administered 2017-05-20 – 2017-05-22 (×7): 3 mL via INTRAVENOUS

## 2017-05-20 NOTE — Progress Notes (Signed)
Continuous EFM removed, pt will remain on continuous Toco per Dr Ladean Raya. Strip reviewed prior to removal

## 2017-05-20 NOTE — Progress Notes (Signed)
Patient ID: Kimberly Foster, female   DOB: Jun 26, 1990, 27 y.o.   MRN: [id number] ACULTY PRACTICE ANTEPARTUM COMPREHENSIVE PROGRESS NOTE  Kimberly Foster is a 27 y.o. G2P0101 at [redacted]w[redacted]d  who is admitted for vaginal bleeding vs PPROM .   Fetal presentation is breech. Length of Stay:  1  Days  Subjective: Pt denies further bleeding or pain Patient reports good fetal movement.  She reports no uterine contractions, no loss of fluid per vagina.  Vitals:  Blood pressure 118/83, pulse 75, temperature 97.8 F (36.6 C), temperature source Oral, resp. rate 18, height  (1.549 m), weight 139 lb 4 oz (63.2 kg), last menstrual period 11/18/2016, SpO2 100 %. Physical Examination: General appearance - alert, well appearing, and in no distress Chest - clear to auscultation, no wheezes, rales or rhonchi, symmetric air entry Heart - normal rate, regular rhythm, normal S1, S2, no murmurs, rubs, clicks or gallops Abdomen - soft, nontender, nondistended, no masses or organomegaly Extremities - peripheral pulses normal, no pedal edema, no clubbing or cyanosis Cervical Exam: Not evaluated.  Membranes:equivocal   Fetal Monitoring:  Baseline: 120's bpm, Variability: Fair (1-6 bpm), Accelerations: Non-reactive but appropriate for gestational age and Decelerations: Absent  Labs:  Results for orders placed or performed during the hospital encounter of 05/19/17 (from the past 24 hour(s))  Amnisure rupture of membrane (rom)not at [facility]   Collection Time: 05/19/17 11:42 AM  Result Value Ref Range   Amnisure ROM POSITIVE   CBC   Collection Time: 05/19/17 11:53 AM  Result Value Ref Range   WBC 22.5 (H) 4.0 - 10.5 K/uL   RBC 3.70 (L) 3.87 - 5.11 MIL/uL   Hemoglobin 11.4 (L) 12.0 - 15.0 g/dL   HCT 21.3 (L) 08.6 - 57.8 %   MCV 89.2 78.0 - 100.0 fL   MCH 30.8 26.0 - 34.0 pg   MCHC 34.5 30.0 - 36.0 g/dL   RDW 46.9 62.9 - 52.8 %   Platelets 244 150 - 400 K/uL  CBC   Collection Time: 05/19/17  4:05 PM  Result  Value Ref Range   WBC 21.8 (H) 4.0 - 10.5 K/uL   RBC 3.50 (L) 3.87 - 5.11 MIL/uL   Hemoglobin 10.8 (L) 12.0 - 15.0 g/dL   HCT 41.3 (L) 24.4 - 01.0 %   MCV 89.7 78.0 - 100.0 fL   MCH 30.9 26.0 - 34.0 pg   MCHC 34.4 30.0 - 36.0 g/dL   RDW 27.2 53.6 - 64.4 %   Platelets 241 150 - 400 K/uL    Imaging Studies:    05/19/2017 Impression  SIUP at 26+0 weeks with cardiac activity  Breech presentation  Low normal amniotic fluid volume  Anterior placenta; no previa; large subchorionic fluid  collection/hemorrhage inferior to placenta (measurements  above) c/w abruption ---------------------------------------------------------------------- Recommendations  Follow-up ultrasound for growth if able to stabilize pt  Medications:  Scheduled . [START ON 05/21/2017] amoxicillin  500 mg Oral Q8H  . azithromycin  500 mg Oral Daily  . betamethasone acetate-betamethasone sodium phosphate  12 mg Intramuscular Q24 Hr x 2  . prenatal multivitamin  1 tablet Oral Q1200   I have reviewed the patient's current medications.  ASSESSMENT: Patient Active Problem List   Diagnosis Date Noted  . Vaginal bleeding in pregnancy 05/19/2017  . ?PPROM on 4/29 05/19/2017  . Malpresentation before onset of labor 05/19/2017  . Previous cesarean section 05/01/2017  . UTI (urinary tract infection) during pregnancy, first trimester 05/01/2017  . Supervision of high-risk pregnancy  02/05/2017  . Smoker 02/05/2017  . Anxiety 01/20/2017  . History of preterm delivery, currently pregnant 01/08/2017  . Hx of preeclampsia, prior pregnancy, currently pregnant 01/08/2017    PLAN: Recheck amniotic fluid in 2-3 days Keep atbx for latency.  Stop magnesium  Deliver for fetal or maternal complications watch for sign/sx of continued bleeding  Continue routine antenatal care.   Kimberly Foster 05/20/2017,8:49 AM

## 2017-05-21 NOTE — Progress Notes (Signed)
Patient ID: Kimberly Foster, female   DOB: 03-Nov-1990, 27 y.o.   MRN: [id number] ACULTY PRACTICE ANTEPARTUM COMPREHENSIVE PROGRESS NOTE  Kimberly Foster is a 27 y.o. G2P0101 at [redacted]w[redacted]d  who is admitted for possible PROM vs VB.   Fetal presentation is unsure. Length of Stay:  2  Days  Subjective: Pt denies active bleeding since admission  Patient reports good fetal movement.  She reports no uterine contractions,  and no loss of fluid per vagina.  Vitals:  Blood pressure 129/84, pulse (!) 48, temperature 98.1 F (36.7 C), temperature source Oral, resp. rate 16, height  (1.549 m), weight 139 lb 4 oz (63.2 kg), last menstrual period 11/18/2016, SpO2 100 %. Physical Examination: General appearance - alert, well appearing, and in no distress Abdomen - soft, nontender, nondistended, no masses or organomegaly gravid Extremities - peripheral pulses normal, no pedal edema, no clubbing or cyanosis Cervical Exam: Not evaluated.  Membranes:intact  Fetal Monitoring:  Baseline: 150's bpm, Variability: Good {> 6 bpm) and Accelerations: Non-reactive but appropriate for gestational age  Labs:  No results found for this or any previous visit (from the past 24 hour(s)).  Imaging Studies:    None pending   Medications:  Scheduled . amoxicillin  500 mg Oral Q8H  . azithromycin  500 mg Oral Daily  . [START ON 06/19/2017] prenatal vitamin w/FE, FA  1 tablet Oral Q1200  . sodium chloride flush  3 mL Intravenous Q8H   I have reviewed the patient's current medications.  ASSESSMENT: Patient Active Problem List   Diagnosis Date Noted  . Vaginal bleeding in pregnancy 05/19/2017  . ?PPROM on 4/29 05/19/2017  . Malpresentation before onset of labor 05/19/2017  . Previous cesarean section 05/01/2017  . UTI (urinary tract infection) during pregnancy, first trimester 05/01/2017  . Supervision of high-risk pregnancy 02/05/2017  . Smoker 02/05/2017  . Anxiety 01/20/2017  . History of preterm delivery,  currently pregnant 01/08/2017  . Hx of preeclampsia, prior pregnancy, currently pregnant 01/08/2017    PLAN: Recheck amniotic fluid in am Keep atbx for latency.  S/p course of BMZ Deliver for fetal or maternal complications watch for sign/sx of continued bleeding  Continue routine antenatal care.   Kimberly Foster 05/21/2017,9:42 AM

## 2017-05-22 ENCOUNTER — Inpatient Hospital Stay (HOSPITAL_COMMUNITY): Payer: BLUE CROSS/BLUE SHIELD

## 2017-05-22 MED ORDER — SODIUM CHLORIDE 0.9% FLUSH
3.0000 mL | Freq: Two times a day (BID) | INTRAVENOUS | Status: DC
  Administered 2017-05-23 – 2017-06-03 (×21): 3 mL via INTRAVENOUS

## 2017-05-22 NOTE — Progress Notes (Signed)
Patient ID: Kimberly Foster, female   DOB: [birth year], 27 y.o.   MRN: [id number] ACULTY PRACTICE ANTEPARTUM COMPREHENSIVE PROGRESS NOTE  Trent Theisen is a 27 y.o. G2P0101 at [redacted]w[redacted]d  who is admitted for vaginal bleeding with possible PROM.   Fetal presentation is breech. Length of Stay:  3  Days  Subjective: Pt reports brown discharge but no active bleeding or clear drainage since admission.  Patient reports good fetal movement.  She reports no uterine contractions.  Vitals:  Blood pressure 119/80, pulse (!) 55, temperature 98 F (36.7 C), temperature source Oral, resp. rate 16, height  (1.549 m), weight 139 lb 4 oz (63.2 kg), last menstrual period 11/18/2016, SpO2 100 %. Physical Examination: General appearance - alert, well appearing, and in no distress Abdomen - soft, nontender, nondistended, no masses or organomegaly gravid Cervical Exam: Not evaluated. Extremities: extremities normal, atraumatic, no cyanosis or edema  Membranes:intact, equivocally ruptured.   Fetal Monitoring:  Baseline: 150's bpm, Variability: Good {> 6 bpm), Accelerations: Non-reactive but appropriate for gestational age and Decelerations: Absent  Labs:  No results found for this or any previous visit (from the past 24 hour(s)).  Imaging Studies:    05/22/2017 Impression  Singleton intrauterine pregnancy at 26+3 weeks with vaginal  bleeding here for requested limited scan  Breech presentation  Normal fetal movement and cardiac activity  Placenta is anterior and without previa. There is a hypoechoci  area within the placenta as noted above.  Amniotic fluid volume is subjectively low-normal but a MVP of  4.5 cm is noted  Estimated fetal weight shows growth in the 39th percentile ---------------------------------------------------------------------- Recommendations  Continue clinical evaluation and management.  Medications:  Scheduled . amoxicillin  500 mg Oral Q8H  . azithromycin  500 mg Oral Daily   . [START ON 06/19/2017] prenatal vitamin w/FE, FA  1 tablet Oral Q1200  . sodium chloride flush  3 mL Intravenous Q8H   I have reviewed the patient's current medications.  ASSESSMENT: Patient Active Problem List   Diagnosis Date Noted  . Vaginal bleeding in pregnancy 05/19/2017  . ?PPROM on 4/29 05/19/2017  . Malpresentation before onset of labor 05/19/2017  . Previous cesarean section 05/01/2017  . UTI (urinary tract infection) during pregnancy, first trimester 05/01/2017  . Supervision of high-risk pregnancy 02/05/2017  . Smoker 02/05/2017  . Anxiety 01/20/2017  . History of preterm delivery, currently pregnant 01/08/2017  . Hx of preeclampsia, prior pregnancy, currently pregnant 01/08/2017    PLAN: Repeat Amnisure when no blood is noted Keep atbx for latency.  S/p course of BMZ Deliver for fetal or maternal complications watch for sign/sx of continued bleeding Continue routine antenatal care.  Jujuan Dugo Harraway-Smith 05/22/2017,10:11 AM

## 2017-05-22 NOTE — Plan of Care (Signed)
  Problem: Education: Goal: Knowledge of General Education information will improve Outcome: Completed/Met

## 2017-05-23 LAB — CBC
HCT: 29 % — ABNORMAL LOW (ref 36.0–46.0)
Hemoglobin: 10 g/dL — ABNORMAL LOW (ref 12.0–15.0)
MCH: 30.9 pg (ref 26.0–34.0)
MCHC: 34.5 g/dL (ref 30.0–36.0)
MCV: 89.5 fL (ref 78.0–100.0)
PLATELETS: 222 10*3/uL (ref 150–400)
RBC: 3.24 MIL/uL — AB (ref 3.87–5.11)
RDW: 12.6 % (ref 11.5–15.5)
WBC: 17.8 10*3/uL — ABNORMAL HIGH (ref 4.0–10.5)

## 2017-05-23 LAB — COMPREHENSIVE METABOLIC PANEL
ALT: 15 U/L (ref 14–54)
AST: 20 U/L (ref 15–41)
Albumin: 2.5 g/dL — ABNORMAL LOW (ref 3.5–5.0)
Alkaline Phosphatase: 70 U/L (ref 38–126)
Anion gap: 11 (ref 5–15)
BUN: 15 mg/dL (ref 6–20)
CHLORIDE: 106 mmol/L (ref 101–111)
CO2: 21 mmol/L — AB (ref 22–32)
CREATININE: 0.6 mg/dL (ref 0.44–1.00)
Calcium: 9.7 mg/dL (ref 8.9–10.3)
GFR calc Af Amer: >60 mL/min (ref >60)
Glucose, Bld: 112 mg/dL — ABNORMAL HIGH (ref 65–99)
POTASSIUM: 3.6 mmol/L (ref 3.5–5.1)
SODIUM: 138 mmol/L (ref 135–145)
Total Bilirubin: 0.3 mg/dL (ref 0.3–1.2)
Total Protein: 5.9 g/dL — ABNORMAL LOW (ref 6.5–8.1)

## 2017-05-23 LAB — TYPE AND SCREEN
ABO/RH(D): A POS
Antibody Screen: NEGATIVE

## 2017-05-23 LAB — PROTEIN / CREATININE RATIO, URINE
CREATININE, URINE: 43 mg/dL
PROTEIN CREATININE RATIO: 0.19 mg/mg{creat} — AB (ref 0.00–0.15)
TOTAL PROTEIN, URINE: 8 mg/dL

## 2017-05-23 MED ORDER — COMPLETENATE 29-1 MG PO CHEW
1.0000 | CHEWABLE_TABLET | Freq: Every day | ORAL | Status: DC
  Administered 2017-05-23 – 2017-06-03 (×12): 1 via ORAL
  Filled 2017-05-23 (×14): qty 1

## 2017-05-23 NOTE — Progress Notes (Signed)
RN Notified ° °

## 2017-05-23 NOTE — Progress Notes (Addendum)
Patient ID: Kimberly Foster, female   DOB: 01/04/1991, 27 y.o.   MRN: [id number] ACULTY PRACTICE ANTEPARTUM COMPREHENSIVE PROGRESS NOTE  Zada Haser is a 27 y.o. G2P0101 at [redacted]w[redacted]d  who is admitted for bleeding in pregnancy.   Fetal presentation is breech. Length of Stay:  4  Days  Subjective: Pt report bright red blood overnight.  Does not think she is leaking clear fluid but, cannot tell due to the bleeding.  BPs elevated earlier today. No HA or visual changes.   Patient reports good fetal movement.  She reports no uterine contraction.  Vitals:  Blood pressure (!) 168/83, pulse (!) 46, temperature 97.9 F (36.6 C), temperature source Oral, resp. rate 16, height  (1.549 m), weight 139 lb 4 oz (63.2 kg), last menstrual period 11/18/2016, SpO2 100 %. Physical Examination: General appearance - alert, well appearing, and in no distress Abdomen - soft, nontender, nondistended, no masses or organomegaly Extremities - peripheral pulses normal, no pedal edema, no clubbing or cyanosis Cervical Exam: Not evaluated Membranes: equivacol   Fetal Monitoring:  Baseline: 150's bpm, Variability: Good {> 6 bpm), Accelerations: Non-reactive but appropriate for gestational age and Decelerations: Absent  Labs:  No results found for this or any previous visit (from the past 24 hour(s)).  Imaging Studies:    05/22/2017 Impression  Singleton intrauterine pregnancy at 26+3 weeks with vaginal  bleeding here for requested limited scan  Breech presentation  Normal fetal movement and cardiac activity  Placenta is anterior and without previa. There is a hypoechoci  area within the placenta as noted above.  Amniotic fluid volume is subjectively low-normal but a MVP of  4.5 cm is noted  Estimated fetal weight shows growth in the 39th percentile ---------------------------------------------------------------------- Recommendations  Continue clinical evaluation and management.  Medications:  Scheduled .  amoxicillin  500 mg Oral Q8H  . azithromycin  500 mg Oral Daily  . [START ON 06/19/2017] prenatal vitamin w/FE, FA  1 tablet Oral Q1200  . sodium chloride flush  3 mL Intravenous Q12H   I have reviewed the patient's current medications.  ASSESSMENT: Patient Active Problem List   Diagnosis Date Noted  . Vaginal bleeding in pregnancy 05/19/2017  . ?PPROM on 4/29 05/19/2017  . Malpresentation before onset of labor 05/19/2017  . Previous cesarean section 05/01/2017  . UTI (urinary tract infection) during pregnancy, first trimester 05/01/2017  . Supervision of high-risk pregnancy 02/05/2017  . Smoker 02/05/2017  . Anxiety 01/20/2017  . History of preterm delivery, currently pregnant 01/08/2017  . Hx of preeclampsia, prior pregnancy, currently pregnant 01/08/2017    PLAN: Closely monitor BPs Pr:Cr and PIH labs today Repeat Amnisure when no blood is noted Keep atbx for latency. S/p course of BMZ Deliver for fetal or maternal complications watch for sign/sx of continued bleeding Continue routine antenatal care  [facility] 05/23/2017,10:38 AM

## 2017-05-24 DIAGNOSIS — Z3A26 26 weeks gestation of pregnancy: Secondary | ICD-10-CM

## 2017-05-24 DIAGNOSIS — O4292 Full-term premature rupture of membranes, unspecified as to length of time between rupture and onset of labor: Secondary | ICD-10-CM

## 2017-05-24 NOTE — Progress Notes (Signed)
Pt. Off Unit

## 2017-05-24 NOTE — Plan of Care (Signed)
Patient is resting in bed at this time. Small amount of dark red blood noted on peri pad. Patient states, "I'm not bleeding as much as I was before. I only notice bleeding when I wipe after I use the bathroom." Patient denies any pain or discomfort.

## 2017-05-24 NOTE — Progress Notes (Signed)
Patient ID: Kimberly Foster, female   DOB: 12-08-1990, 27 y.o.   MRN: [id number] ACULTY PRACTICE ANTEPARTUM COMPREHENSIVE PROGRESS NOTE  Kimberly Foster is a 27 y.o. G2P0101 at [redacted]w[redacted]d  who is admitted for bleeding in pregnancy.   Fetal presentation is breech. Length of Stay:  5  Days  Subjective: Reported small amount of continued bright red bleeding overnight.  Does not think she is leaking clear fluid but, cannot tell due to the bleeding.  BPs elevated earlier yesterday, normal labs. No HA or visual changes.    Patient reports good fetal movement.  She reports no uterine contraction.  Vitals:  Blood pressure 129/80, pulse (!) 50, temperature 98.5 F (36.9 C), temperature source Oral, resp. rate 17, height  (1.549 m), weight 139 lb 4 oz (63.2 kg), last menstrual period 11/18/2016, SpO2 100 %. Physical Examination: General appearance - alert, well appearing, and in no distress Abdomen - soft, nontender, nondistended, no masses or organomegaly Extremities - peripheral pulses normal, no pedal edema, no clubbing or cyanosis Cervical Exam: Not evaluated Membranes: equivacol   Fetal Monitoring:  Baseline: 150's bpm, Variability: Good {> 6 bpm), Accelerations: Non-reactive but appropriate for gestational age and Decelerations: Absent x 3  Labs:  Results for orders placed or performed during the hospital encounter of 05/19/17 (from the past 24 hour(s))  Type and screen [facility] [place]   Collection Time: 05/23/17  8:16 AM  Result Value Ref Range   ABO/RH(D) A POS    Antibody Screen NEG    Sample Expiration      05/26/2017 Performed at [facility], [address] [place], [place] [address]   CBC   Collection Time: 05/23/17  3:07 PM  Result Value Ref Range   WBC 17.8 (H) 4.0 - 10.5 K/uL   RBC 3.24 (L) 3.87 - 5.11 MIL/uL   Hemoglobin 10.0 (L) 12.0 - 15.0 g/dL   HCT 19.1 (L) 47.8 - 29.5 %   MCV 89.5 78.0 - 100.0 fL   MCH 30.9 26.0 - 34.0 pg   MCHC 34.5 30.0 -  36.0 g/dL   RDW 62.1 30.8 - 65.7 %   Platelets 222 150 - 400 K/uL  Comprehensive metabolic panel   Collection Time: 05/23/17  3:07 PM  Result Value Ref Range   Sodium 138 135 - 145 mmol/L   Potassium 3.6 3.5 - 5.1 mmol/L   Chloride 106 101 - 111 mmol/L   CO2 21 (L) 22 - 32 mmol/L   Glucose, Bld 112 (H) 65 - 99 mg/dL   BUN 15 6 - 20 mg/dL   Creatinine, Ser 8.46 0.44 - 1.00 mg/dL   Calcium 9.7 8.9 - 96.2 mg/dL   Total Protein 5.9 (L) 6.5 - 8.1 g/dL   Albumin 2.5 (L) 3.5 - 5.0 g/dL   AST 20 15 - 41 U/L   ALT 15 14 - 54 U/L   Alkaline Phosphatase 70 38 - 126 U/L   Total Bilirubin 0.3 0.3 - 1.2 mg/dL   GFR calc non Af Amer >60 >60 mL/min   GFR calc Af Amer >60 >60 mL/min   Anion gap 11 5 - 15  Protein / creatinine ratio, urine   Collection Time: 05/23/17  4:07 PM  Result Value Ref Range   Creatinine, Urine 43.00 mg/dL   Total Protein, Urine 8 mg/dL   Protein Creatinine Ratio 0.19 (H) 0.00 - 0.15 mg/mg[Cre]    Imaging Studies:    05/22/2017 Impression  Singleton intrauterine pregnancy at 26+3 weeks  with vaginal  bleeding here for requested limited scan  Breech presentation  Normal fetal movement and cardiac activity  Placenta is anterior and without previa. There is a hypoechoci  area within the placenta as noted above.  Amniotic fluid volume is subjectively low-normal but a MVP of  4.5 cm is noted  Estimated fetal weight shows growth in the 39th percentile ---------------------------------------------------------------------- Recommendations  Continue clinical evaluation and management.  Medications:  Scheduled . amoxicillin  500 mg Oral Q8H  . azithromycin  500 mg Oral Daily  . prenatal vitamin w/FE, FA  1 tablet Oral Q1200  . sodium chloride flush  3 mL Intravenous Q12H   I have reviewed the patient's current medications.  ASSESSMENT: Patient Active Problem List   Diagnosis Date Noted  . Vaginal bleeding in pregnancy 05/19/2017  . ?PPROM on 4/29 05/19/2017  .  Malpresentation before onset of labor 05/19/2017  . Previous cesarean section 05/01/2017  . UTI (urinary tract infection) during pregnancy, first trimester 05/01/2017  . Supervision of high-risk pregnancy 02/05/2017  . Smoker 02/05/2017  . Anxiety 01/20/2017  . History of preterm delivery, currently pregnant 01/08/2017  . Hx of preeclampsia, prior pregnancy, currently pregnant 01/08/2017    PLAN: Consider repeat Amnisure when no blood is noted Continue antibiotics for latency; S/p course of BMZ Deliver for fetal or maternal complications Watch for sign/sx of continued bleeding Continue routine antenatal care  [name], MD 05/24/2017,7:31 AM

## 2017-05-25 ENCOUNTER — Encounter: Payer: Self-pay | Admitting: Women's Health

## 2017-05-25 DIAGNOSIS — R001 Bradycardia, unspecified: Secondary | ICD-10-CM | POA: Clinically undetermined

## 2017-05-25 NOTE — Progress Notes (Signed)
Daily Antepartum Note  Admission Date: 05/19/2017 Current Date: 05/25/2017 7:02 AM  Kimberly Foster is a 27 y.o. G2P0101 @ [redacted]w[redacted]d, HD#7, admitted for VB, large [facility].  Pregnancy complicated by: Patient Active Problem List   Diagnosis Date Noted  . Vaginal bleeding in pregnancy 05/19/2017  . ?PPROM on 4/29 05/19/2017  . Malpresentation before onset of labor 05/19/2017  . Previous cesarean section 05/01/2017  . UTI (urinary tract infection) during pregnancy, first trimester 05/01/2017  . Supervision of high-risk pregnancy 02/05/2017  . Smoker 02/05/2017  . Anxiety 01/20/2017  . History of preterm delivery, currently pregnant 01/08/2017  . Hx of preeclampsia, prior pregnancy, currently pregnant 01/08/2017    Overnight/24hr events:  Still having some vaginal spotting (old blood and BRB)  Subjective:  See above. No pain or decreased FM or fluid.   Objective:   Vitals:   05/24/17 2312 05/25/17 0540  BP: 128/80 120/77  Pulse: (!) 58 (!) 59  Resp: 17 16  Temp: 98.2 F (36.8 C) 98.2 F (36.8 C)  SpO2: 99% 99%    Current Vital Signs 24h Vital Sign Ranges  T 98.2 F (36.8 C) Temp  Avg: 98.2 F (36.8 C)  Min: 98 F (36.7 C)  Max: 98.4 F (36.9 C)  BP 120/77 BP  Min: 117/70  Max: 136/78  HR (!) 59 Pulse  Avg: 57.8  Min: 54  Max: 62  RR 16 Resp  Avg: 16.5  Min: 16  Max: 18  SaO2 99 % Room Air SpO2  Avg: 99.3 %  Min: 99 %  Max: 100 %       24 Hour I/O Current Shift I/O  Time Ins Outs 05/04 0701 - 05/05 0700 In: 719 [P.O.:716; I.V.:3] Out: -  No intake/output data recorded.   FHT: 150 baseline, +accels, no decel, mod variability Toco: quiet x 24m  Physical exam: General: Well nourished, well developed female in no acute distress. Abdomen: gravid, NTTP Cardiovascular: S1, S2 normal, no murmur, rub or gallop, regular rate and rhythm Respiratory: CTAB Extremities: no clubbing, cyanosis or edema Skin: Warm and dry.   Medications: Current Facility-Administered Medications   Medication Dose Route Frequency Provider Last Rate Last Dose  . acetaminophen (TYLENOL) tablet 650 mg  650 mg Oral Q4H PRN [place] Bing, MD      . amoxicillin (AMOXIL) capsule 500 mg  500 mg Oral Q8H [place] Bing, MD   500 mg at 05/25/17 0557  . azithromycin (ZITHROMAX) tablet 500 mg  500 mg Oral Daily [place] Bing, MD   500 mg at 05/24/17 1016  . butalbital-acetaminophen-caffeine (FIORICET, ESGIC) 50-325-40 MG per tablet 1 tablet  1 tablet Oral Q8H PRN [place] Bing, MD   1 tablet at 05/20/17 1245  . calcium carbonate (TUMS - dosed in mg elemental calcium) chewable tablet 400 mg of elemental calcium  2 tablet Oral PRN Constant, Peggy, MD   400 mg of elemental calcium at 05/24/17 2208  . ondansetron (ZOFRAN) injection 4 mg  4 mg Intravenous Q6H PRN [place] Bing, MD      . prenatal vitamin w/FE, FA (NATACHEW) chewable tablet 1 tablet  1 tablet Oral Q1200 [place] Rosenthal, MD   1 tablet at 05/24/17 1016  . sodium chloride flush (NS) 0.9 % injection 3 mL  3 mL Intravenous Q12H [place] Rosenthal, MD   3 mL at 05/24/17 2209  . sodium citrate-citric acid (ORACIT) solution 30 mL  30 mL Oral Q2H PRN  Bing, MD   30 mL at 05/20/17 [phone]  Labs:  Recent Labs  Lab 05/19/17 1153 05/19/17 1605 05/23/17 1507  WBC 22.5* 21.8* 17.8*  HGB 11.4* 10.8* 10.0*  HCT 33.0* 31.4* 29.0*  PLT 244 241 222    Recent Labs  Lab 05/23/17 1507  NA 138  K 3.6  CL 106  CO2 21*  BUN 15  CREATININE 0.60  CALCIUM 9.7  PROT 5.9*  BILITOT 0.3  ALKPHOS 70  ALT 15  AST 20  GLUCOSE 112*    Radiology: no new imaging  Assessment & Plan:  Pt stable *Pregnancy: PNV, NSTs BID *VB: continue to follow. Rh pos and t&s UTD. Will get AFI and f/u u/s tomorrow  *Preterm: consider NICU consult as patient may be here for prolonged stay given chronic bleeding -s/p BMZ on 4/29 and 4/30 -s/p Mg on admission *?PPROM: f/u AFI and would repeat sterile spec exam tomorrow and see if  can send another amnisure if bleeding isn't a lot. Las day of latency abx today.  *prior c/s: fetus currently breech.  *PPx: SCDs, OOB ad lib *FEN/GI: regular diet *Dispo: not in the foreseeable future given chronic bleeding.   Cornelia Copa MD Attending Center for [facility])

## 2017-05-25 NOTE — Progress Notes (Signed)
Pt is resting in bed at this time. Pt states bleeding has lightened up. Pt states she still notices scant amount of bright red blood on toilet paper when she wipes. No blood or clots noted on pad at this time. Informed pt to notify RN if she notices any changes in bleeding (color, amount, and clots). Reminded pt to save pads with blood on them and also reminded pt not to flush any clots down the toilet before RN assesses. Pt denies any pain or discomfort at this time.

## 2017-05-26 ENCOUNTER — Inpatient Hospital Stay (HOSPITAL_COMMUNITY): Payer: BLUE CROSS/BLUE SHIELD

## 2017-05-26 LAB — TYPE AND SCREEN
ABO/RH(D): A POS
Antibody Screen: NEGATIVE

## 2017-05-26 LAB — CBC
HCT: 31.1 % — ABNORMAL LOW (ref 36.0–46.0)
Hemoglobin: 10.6 g/dL — ABNORMAL LOW (ref 12.0–15.0)
MCH: 30.7 pg (ref 26.0–34.0)
MCHC: 34.1 g/dL (ref 30.0–36.0)
MCV: 90.1 fL (ref 78.0–100.0)
PLATELETS: 227 10*3/uL (ref 150–400)
RBC: 3.45 MIL/uL — ABNORMAL LOW (ref 3.87–5.11)
RDW: 12.8 % (ref 11.5–15.5)
WBC: 18.7 10*3/uL — ABNORMAL HIGH (ref 4.0–10.5)

## 2017-05-26 NOTE — Progress Notes (Signed)
Patient ID: Kimberly Foster, female   DOB: [birth year], 27 y.o.   MRN: [id number] ACULTY PRACTICE ANTEPARTUM COMPREHENSIVE PROGRESS NOTE  Kimberly Foster is a 27 y.o. G2P0101 at [redacted]w[redacted]d  who is admitted for bleeding in pregnancy.   Fetal presentation is breech. Length of Stay:  7  Days  Subjective: Reported small amount of continued bright red spotting/bleeding overnight.  Does not think she is leaking clear fluid but, cannot tell due to the bleeding.  Patient reports good fetal movement.  She reports no uterine contraction.  Vitals:  Blood pressure 109/63, pulse (!) 55, temperature 98.7 F (37.1 C), temperature source Oral, resp. rate 18, height  (1.549 m), weight 139 lb 4 oz (63.2 kg), last menstrual period 11/18/2016, SpO2 100 %. Physical Examination: General appearance - alert, well appearing, and in no distress Abdomen - soft, nontender, nondistended, no masses or organomegaly Extremities - peripheral pulses normal, no pedal edema, no clubbing or cyanosis Cervical Exam: Not evaluated Membranes status - equivocal  Fetal Monitoring:  Baseline: 150's bpm, Variability: Good {> 6 bpm), Accelerations: Non-reactive but appropriate for gestational age and Decelerations: Absent; x 3  Labs:  Results for orders placed or performed during the hospital encounter of 05/19/17 (from the past 24 hour(s))  Type and screen [facility] [place]   Collection Time: 05/26/17  7:38 AM  Result Value Ref Range   ABO/RH(D) A POS    Antibody Screen NEG    Sample Expiration      05/29/2017 Performed at [facility], [address], [place], [place] [address]     Imaging Studies:    05/22/2017 Impression  Singleton intrauterine pregnancy at 26+3 weeks with vaginal  bleeding here for requested limited scan  Breech presentation  Normal fetal movement and cardiac activity  Placenta is anterior and without previa. There is a hypoechoci  area within the placenta as noted above.  Amniotic  fluid volume is subjectively low-normal but a MVP of  4.5 cm is noted  Estimated fetal weight shows growth in the 39th percentile ---------------------------------------------------------------------- Recommendations  Continue clinical evaluation and management.  Medications:  Scheduled . azithromycin  500 mg Oral Daily  . prenatal vitamin w/FE, FA  1 tablet Oral Q1200  . sodium chloride flush  3 mL Intravenous Q12H   I have reviewed the patient's current medications.  ASSESSMENT: Patient Active Problem List   Diagnosis Date Noted  . Bradycardia 05/25/2017  . Vaginal bleeding in pregnancy 05/19/2017  . ?PPROM on 4/29 05/19/2017  . Malpresentation before onset of labor 05/19/2017  . Previous cesarean section 05/01/2017  . UTI (urinary tract infection) during pregnancy, first trimester 05/01/2017  . Supervision of high-risk pregnancy 02/05/2017  . Smoker 02/05/2017  . Anxiety 01/20/2017  . History of preterm delivery, currently pregnant 01/08/2017  . Hx of preeclampsia, prior pregnancy, currently pregnant 01/08/2017    PLAN: Repeat scan ordered for today, will follow up results and manage accordingly. Consider repeat Amnisure when no blood is noted Continue antibiotics for latency; S/p course of BMZ 4/29-30 and magnesium sulfate Deliver for fetal or maternal complications; has previous cesarean section and breech presentation. Check CBC, T&S q72 hours. Continues to need inpatient care given chronic bleeding and equivoca; PPROM status; patient is aware Continue routine antenatal care  [name], MD 05/26/2017,9:44 AM

## 2017-05-26 NOTE — Consult Note (Signed)
Neonatology Consult Note:  At the request of the patients obstetrician Dr. Harolyn Rutherford I met with Roland Earl who is a 27 y.o. G2P0101 at 20w0dwho is admitted for bleeding in pregnancy.  Unknown ROM (likely 4/29) due to inability to perform amnisure with bleeding.  On latency antibiotics and status post a course of betamethasone and magnesium sulfate.  Of note [place] a [place] her son is now 468years old and is doing well.     We discussed morbidity/mortality at this gestional age, delivery room resuscitation, including intubation and surfactant in DR.  Discussed mechanical ventilation and risk for chronic lung disease, risk for IVH with potential for motor / cognitive deficits, ROP, NEC, sepsis, as well as temperature instability and feeding immaturity.  Discussed NG / OG feeds, benefits of MBM in reducing incidence of NEC.   Discussed likely length of stay. Thank you for allowing [facility] participate in her care.  Please call with questions.  BHiginio Roger DO  Neonatologist  The total length of face-to-face or floor / unit time for this encounter was 25 minutes.  Counseling and / or coordination of care was greater than fifty percent of the time.

## 2017-05-27 DIAGNOSIS — Z3A27 27 weeks gestation of pregnancy: Secondary | ICD-10-CM

## 2017-05-27 MED ORDER — LACTATED RINGERS IV BOLUS
500.0000 mL | Freq: Once | INTRAVENOUS | Status: AC
  Administered 2017-05-27: 13:00:00 via INTRAVENOUS

## 2017-05-27 NOTE — Progress Notes (Signed)
Patient ID: Kimberly Foster, female   DOB: 03/03/1990, 27 y.o.   MRN: [id number] ACULTY PRACTICE ANTEPARTUM COMPREHENSIVE PROGRESS NOTE  Kimberly Foster is a 27 y.o. G2P0101 at [redacted]w[redacted]d  who is admitted for bleeding in pregnancy.   Fetal presentation is breech. Length of Stay:  8  Days  Subjective: Reported passing small clots and bleeding overnight.  Patient reports good fetal movement.  She reports no uterine contraction.  Vitals:  Blood pressure 125/79, pulse 75, temperature 98 F (36.7 C), temperature source Oral, resp. rate 16, height  (1.549 m), weight 139 lb 4 oz (63.2 kg), last menstrual period 11/18/2016, SpO2 99 %. Physical Examination: General appearance - alert, well appearing, and in no distress Abdomen - soft, nontender, nondistended, no masses or organomegaly Extremities - no pedal edema, no clubbing or cyanosis Cervical Exam: Not evaluated Membranes status - equivocal  Fetal Monitoring:  Baseline: 150's bpm, Variability: Good {> 6 bpm), Accelerations: Non-reactive but appropriate for gestational age and Decelerations: Absent; x 3  Labs:  No results found for this or any previous visit (from the past 24 hour(s)).  Imaging Studies:    05/26/2017 Impression  Singleton intrauterine pregnancy at 27+0 weeks with vaginal  bleeding here for requested limited scan  Presentation is breech  Normal fetal movement and cardiac activity  Amniotic fluid volume is normal with AFI 10.9 cm  There is a subchorionic fluid collection near the internal os ---------------------------------------------------------------------- Recommendations  Continue clinical evaluation and management.  Medications:  Scheduled . prenatal vitamin w/FE, FA  1 tablet Oral Q1200  . sodium chloride flush  3 mL Intravenous Q12H   I have reviewed the patient's current medications.  ASSESSMENT: Patient Active Problem List   Diagnosis Date Noted  . Bradycardia 05/25/2017  . Vaginal bleeding in pregnancy  05/19/2017  . ?PPROM on 4/29 05/19/2017  . Malpresentation before onset of labor 05/19/2017  . Previous cesarean section 05/01/2017  . UTI (urinary tract infection) during pregnancy, first trimester 05/01/2017  . Supervision of high-risk pregnancy 02/05/2017  . Smoker 02/05/2017  . Anxiety 01/20/2017  . History of preterm delivery, currently pregnant 01/08/2017  . Hx of preeclampsia, prior pregnancy, currently pregnant 01/08/2017    PLAN: Repeat scan showed normal AFI, this is reassuring. S/p latency antibiotics.  Reevaluate in one week.  Consider repeat Amnisure when no blood is noted S/p course of BMZ 4/29-30 and magnesium sulfate; appreciate NICU consult Deliver for fetal or maternal complications; has previous cesarean section and breech presentation. Check CBC, T&S q72 hours. Continues to need inpatient care given chronic bleeding and equivocal PPROM status; patient is aware Continue routine antenatal care  [name], MD 05/27/2017,1:27 PM

## 2017-05-28 DIAGNOSIS — O4690 Antepartum hemorrhage, unspecified, unspecified trimester: Secondary | ICD-10-CM

## 2017-05-28 NOTE — Progress Notes (Signed)
Initial Nutrition Assessment  DOCUMENTATION CODES:   Not applicable  INTERVENTION:  Regular diet May order double protein portions, snacks TID and from retail  NUTRITION DIAGNOSIS:   Increased nutrient needs related to (pregnancy and fetal growth requirements) as evidenced by (27 weeks IUP).  GOAL:   Patient will meet greater than or equal to 90% of their needs  MONITOR:   Weight trends  REASON FOR ASSESSMENT:   Antenatal   ASSESSMENT:   27 2/7 weeks, adm with bleeding. Pre-preg weight 125 lbs. BMI 23.6  Weight gain 14 lbs. reports good appetite  Diet Order:   Diet Order           Diet regular Room service appropriate? Yes; Fluid consistency: Thin  Diet effective now          EDUCATION NEEDS:      Skin:  Skin Assessment: Reviewed RN Assessment  Last BM:     Height:   Ht Readings from Last 1 Encounters:  05/19/17  (1.549 m)    Weight:   Wt Readings from Last 1 Encounters:  05/19/17 139 lb 4 oz (63.2 kg)    Ideal Body Weight:   105 lbs  BMI:  Body mass index is 26.31 kg/m.  Estimated Nutritional Needs:   Kcal:  1600-1800  Protein:  72-82 g  Fluid:  1.9 L    Elisabeth Cara M.Odis Luster LDN Neonatal Nutrition Support Specialist/RD III Pager [phone]      Phone [phone]

## 2017-05-28 NOTE — Progress Notes (Signed)
Patient ID: Kimberly Foster, female   DOB: [birth year], 27 y.o.   MRN: [id number] ACULTY PRACTICE ANTEPARTUM COMPREHENSIVE PROGRESS NOTE  Kimberly Foster is a 27 y.o. G2P0101 at [redacted]w[redacted]d  who is admitted for bleeding in pregnancy.   Fetal presentation is breech. Length of Stay:  9  Days  Subjective: Reported spotting (red blood) overnight.  Patient reports good fetal movement.  She reports no uterine contraction.  Vitals:  Blood pressure 105/67, pulse (!) 58, temperature 98.4 F (36.9 C), temperature source Oral, resp. rate 15, height  (1.549 m), weight 139 lb 4 oz (63.2 kg), last menstrual period 11/18/2016, SpO2 100 %. Physical Examination: General appearance - alert, well appearing, and in no distress Abdomen - soft, nontender, nondistended, no masses or organomegaly Extremities - no pedal edema, no clubbing or cyanosis Cervical Exam: Not evaluated Membranes status - equivocal  Fetal Monitoring:  Baseline: 140's bpm, Variability: Moderate {> 6 bpm), Accelerations: Non-reactive but appropriate for gestational age and Decelerations: Absent; x 3 Tocometer: Quiet  Labs:  No results found for this or any previous visit (from the past 24 hour(s)).  Imaging Studies:    05/26/2017 Impression  Singleton intrauterine pregnancy at 27+0 weeks with vaginal  bleeding here for requested limited scan  Presentation is breech  Normal fetal movement and cardiac activity  Amniotic fluid volume is normal with AFI 10.9 cm  There is a subchorionic fluid collection near the internal os ---------------------------------------------------------------------- Recommendations  Continue clinical evaluation and management.  Medications:  Scheduled . prenatal vitamin w/FE, FA  1 tablet Oral Q1200  . sodium chloride flush  3 mL Intravenous Q12H   I have reviewed the patient's current medications.  ASSESSMENT: Patient Active Problem List   Diagnosis Date Noted  . Bradycardia 05/25/2017  . Vaginal  bleeding in pregnancy 05/19/2017  . ?PPROM on 4/29 05/19/2017  . Malpresentation before onset of labor 05/19/2017  . Previous cesarean section 05/01/2017  . UTI (urinary tract infection) during pregnancy, first trimester 05/01/2017  . Supervision of high-risk pregnancy 02/05/2017  . Smoker 02/05/2017  . Anxiety 01/20/2017  . History of preterm delivery, currently pregnant 01/08/2017  . Hx of preeclampsia, prior pregnancy, currently pregnant 01/08/2017    PLAN: Repeat scan on 5/6/19showed normal AFI, this is reassuring. S/p latency antibiotics.  Reevaluateon 06/02/17. Consider repeat Amnisure when no blood is noted S/p course of BMZ 4/29-30 and magnesium sulfate; appreciate NICU consult Deliver for fetal or maternal complications; has previous cesarean section and breech presentation. Check CBC, T&S q72 hours. Third trimester labs ordered; GTT to be done on 06/01/17.  Continues to need inpatient care given chronic bleeding and equivocal PPROM status; patient is aware Continue routine antenatal care  [name], MD 05/28/2017,10:43 AM

## 2017-05-29 ENCOUNTER — Other Ambulatory Visit: Payer: BLUE CROSS/BLUE SHIELD

## 2017-05-29 ENCOUNTER — Encounter: Payer: BLUE CROSS/BLUE SHIELD | Admitting: Women's Health

## 2017-05-29 LAB — CBC
HEMATOCRIT: 32.9 % — AB (ref 36.0–46.0)
Hemoglobin: 11.1 g/dL — ABNORMAL LOW (ref 12.0–15.0)
MCH: 31.7 pg (ref 26.0–34.0)
MCHC: 33.7 g/dL (ref 30.0–36.0)
MCV: 94 fL (ref 78.0–100.0)
Platelets: 252 10*3/uL (ref 150–400)
RBC: 3.5 MIL/uL — ABNORMAL LOW (ref 3.87–5.11)
RDW: 13.1 % (ref 11.5–15.5)
WBC: 19.1 10*3/uL — AB (ref 4.0–10.5)

## 2017-05-29 LAB — TYPE AND SCREEN
ABO/RH(D): A POS
ANTIBODY SCREEN: NEGATIVE

## 2017-05-29 LAB — HIV ANTIBODY (ROUTINE TESTING W REFLEX): HIV Screen 4th Generation wRfx: NONREACTIVE

## 2017-05-29 LAB — RPR: RPR Ser Ql: NONREACTIVE

## 2017-05-29 NOTE — Progress Notes (Signed)
Patient ID: Kimberly Foster, female   DOB: 11-Jun-1990, 27 y.o.   MRN: [id number] ACULTY PRACTICE ANTEPARTUM COMPREHENSIVE PROGRESS NOTE  Kimberly Foster is a 27 y.o. G2P0101 at [redacted]w[redacted]d  who is admitted for bleeding in pregnancy.   Fetal presentation is breech. Length of Stay:  10  Days  Subjective: No red bleeding since 5/7 night; just brown discharge.  Patient reports good fetal movement.  She reports no uterine contraction. No LOF.  Vitals:  Blood pressure 110/72, pulse (!) 55, temperature 98.5 F (36.9 C), temperature source Oral, resp. rate 18, height  (1.549 m), weight 139 lb 4 oz (63.2 kg), last menstrual period 11/18/2016, SpO2 98 %. Physical Examination: General appearance - alert, well appearing, and in no distress Abdomen - soft, nontender, nondistended, no masses or organomegaly Extremities - no pedal edema, no clubbing or cyanosis Cervical Exam: Not evaluated Membranes status - equivocal  Fetal Monitoring:  Baseline: 150's bpm, Variability: Moderate {> 6 bpm), Accelerations: Non-reactive but appropriate for gestational age and Decelerations: rare variable x 3 Tocometer: Quiet  Labs:  Results for orders placed or performed during the hospital encounter of 05/19/17 (from the past 24 hour(s))  CBC   Collection Time: 05/29/17  5:39 AM  Result Value Ref Range   WBC 19.1 (H) 4.0 - 10.5 K/uL   RBC 3.50 (L) 3.87 - 5.11 MIL/uL   Hemoglobin 11.1 (L) 12.0 - 15.0 g/dL   HCT 78.2 (L) 95.6 - 21.3 %   MCV 94.0 78.0 - 100.0 fL   MCH 31.7 26.0 - 34.0 pg   MCHC 33.7 30.0 - 36.0 g/dL   RDW 08.6 57.8 - 46.9 %   Platelets 252 150 - 400 K/uL  Type and screen [facility] [place]   Collection Time: 05/29/17  5:50 AM  Result Value Ref Range   ABO/RH(D) A POS    Antibody Screen NEG    Sample Expiration      06/01/2017 Performed at [facility], [address], [place], [place] [address]     Imaging Studies:    05/26/2017 Impression  Singleton intrauterine pregnancy  at 27+0 weeks with vaginal  bleeding here for requested limited scan  Presentation is breech  Normal fetal movement and cardiac activity  Amniotic fluid volume is normal with AFI 10.9 cm  There is a subchorionic fluid collection near the internal os ---------------------------------------------------------------------- Recommendations  Continue clinical evaluation and management.  Medications:  Scheduled . prenatal vitamin w/FE, FA  1 tablet Oral Q1200  . sodium chloride flush  3 mL Intravenous Q12H   I have reviewed the patient's current medications.  ASSESSMENT: Patient Active Problem List   Diagnosis Date Noted  . Bradycardia 05/25/2017  . Vaginal bleeding in pregnancy 05/19/2017  . ?PPROM on 4/29 05/19/2017  . Malpresentation before onset of labor 05/19/2017  . Previous cesarean section 05/01/2017  . UTI (urinary tract infection) during pregnancy, first trimester 05/01/2017  . Supervision of high-risk pregnancy 02/05/2017  . Smoker 02/05/2017  . Anxiety 01/20/2017  . History of preterm delivery, currently pregnant 01/08/2017  . Hx of preeclampsia, prior pregnancy, currently pregnant 01/08/2017    PLAN: No bleeding since 5/7 night, this is reassuring Repeat scan on 05/26/17 showed normal AFI, this is reassuring. S/p latency antibiotics.  Reevaluate on 06/02/17. Consider repeat Amnisure when no blood is noted S/p course of BMZ 4/29-30 and magnesium sulfate; appreciate NICU consult Deliver for fetal or maternal complications; has previous cesarean section and breech presentation. Check CBC, T&S q72 hours.  Third trimester labs ordered; GTT to be done on 06/01/17.  Continues to need inpatient care given chronic bleeding and equivocal PPROM status; patient is aware Continue routine antenatal care  [name], MD 05/29/2017,10:17 AM

## 2017-05-29 NOTE — Progress Notes (Signed)
Patient refused fetal monitoring at this time.

## 2017-05-29 NOTE — Progress Notes (Signed)
Pt. Sleep

## 2017-05-30 LAB — AMNISURE RUPTURE OF MEMBRANE (ROM) NOT AT ARMC: AMNISURE: POSITIVE

## 2017-05-30 NOTE — Progress Notes (Signed)
PT in restroom 

## 2017-05-30 NOTE — Progress Notes (Signed)
Went to put patient on fetal monitor and patient refused, stating I'll just do it tonight before bed. Informed and educated patient about the order and monitoring.

## 2017-05-30 NOTE — Progress Notes (Signed)
Patient ID: Kimberly Foster, female   DOB: Jun 17, 1990, 27 y.o.   MRN: [id number] ACULTY PRACTICE ANTEPARTUM COMPREHENSIVE PROGRESS NOTE  Kimberly Foster is a 27 y.o. G2P0101 at [redacted]w[redacted]d  who is admitted for bleeding in pregnancy.   Fetal presentation is breech. Length of Stay:  11  Days  Subjective: Still no red bleeding since 5/7 night; just brown discharge.  Patient reports good fetal movement.  She reports no uterine contraction. No LOF.  Vitals:  Blood pressure 116/78, pulse (!) 54, temperature 98.2 F (36.8 C), temperature source Oral, resp. rate 16, height  (1.549 m), weight 139 lb 4 oz (63.2 kg), last menstrual period 11/18/2016, SpO2 99 %. Physical Examination: General appearance - alert, well appearing, and in no distress Abdomen - soft, nontender, nondistended, no masses or organomegaly Extremities - no pedal edema, no clubbing or cyanosis Cervical Exam: Not evaluated Membranes status - equivocal  Fetal Monitoring:  Baseline: 150's bpm, Variability: Moderate {> 6 bpm), Accelerations: Non-reactive but appropriate for gestational age and Decelerations: rare variable x 3 Tocometer: Quiet  Labs:  No results found for this or any previous visit (from the past 24 hour(s)).  Imaging Studies:    05/26/2017 Impression  Singleton intrauterine pregnancy at 27+0 weeks with vaginal  bleeding here for requested limited scan  Presentation is breech  Normal fetal movement and cardiac activity  Amniotic fluid volume is normal with AFI 10.9 cm  There is a subchorionic fluid collection near the internal os ---------------------------------------------------------------------- Recommendations  Continue clinical evaluation and management.  Results for orders placed or performed during the hospital encounter of 05/19/17 (from the past 168 hour(s))  CBC   Collection Time: 05/23/17  3:07 PM  Result Value Ref Range   WBC 17.8 (H) 4.0 - 10.5 K/uL   RBC 3.24 (L) 3.87 - 5.11 MIL/uL   Hemoglobin 10.0 (L) 12.0 - 15.0 g/dL   HCT 40.9 (L) 81.1 - 91.4 %   MCV 89.5 78.0 - 100.0 fL   MCH 30.9 26.0 - 34.0 pg   MCHC 34.5 30.0 - 36.0 g/dL   RDW 78.2 95.6 - 21.3 %   Platelets 222 150 - 400 K/uL  Comprehensive metabolic panel   Collection Time: 05/23/17  3:07 PM  Result Value Ref Range   Sodium 138 135 - 145 mmol/L   Potassium 3.6 3.5 - 5.1 mmol/L   Chloride 106 101 - 111 mmol/L   CO2 21 (L) 22 - 32 mmol/L   Glucose, Bld 112 (H) 65 - 99 mg/dL   BUN 15 6 - 20 mg/dL   Creatinine, Ser 0.86 0.44 - 1.00 mg/dL   Calcium 9.7 8.9 - 57.8 mg/dL   Total Protein 5.9 (L) 6.5 - 8.1 g/dL   Albumin 2.5 (L) 3.5 - 5.0 g/dL   AST 20 15 - 41 U/L   ALT 15 14 - 54 U/L   Alkaline Phosphatase 70 38 - 126 U/L   Total Bilirubin 0.3 0.3 - 1.2 mg/dL   GFR calc non Af Amer >60 >60 mL/min   GFR calc Af Amer >60 >60 mL/min   Anion gap 11 5 - 15  Protein / creatinine ratio, urine   Collection Time: 05/23/17  4:07 PM  Result Value Ref Range   Creatinine, Urine 43.00 mg/dL   Total Protein, Urine 8 mg/dL   Protein Creatinine Ratio 0.19 (H) 0.00 - 0.15 mg/mg[Cre]  Type and screen [facility] [place]   Collection Time: 05/26/17  7:38 AM  Result Value Ref  Range   ABO/RH(D) A POS    Antibody Screen NEG    Sample Expiration      05/29/2017 Performed at [facility], [address] [place], [place] [address]   CBC   Collection Time: 05/26/17 10:28 AM  Result Value Ref Range   WBC 18.7 (H) 4.0 - 10.5 K/uL   RBC 3.45 (L) 3.87 - 5.11 MIL/uL   Hemoglobin 10.6 (L) 12.0 - 15.0 g/dL   HCT 69.6 (L) 29.5 - 28.4 %   MCV 90.1 78.0 - 100.0 fL   MCH 30.7 26.0 - 34.0 pg   MCHC 34.1 30.0 - 36.0 g/dL   RDW 13.2 44.0 - 10.2 %   Platelets 227 150 - 400 K/uL  CBC   Collection Time: 05/29/17  5:39 AM  Result Value Ref Range   WBC 19.1 (H) 4.0 - 10.5 K/uL   RBC 3.50 (L) 3.87 - 5.11 MIL/uL   Hemoglobin 11.1 (L) 12.0 - 15.0 g/dL   HCT 72.5 (L) 36.6 - 44.0 %   MCV 94.0 78.0 - 100.0 fL   MCH  31.7 26.0 - 34.0 pg   MCHC 33.7 30.0 - 36.0 g/dL   RDW 34.7 42.5 - 95.6 %   Platelets 252 150 - 400 K/uL  RPR   Collection Time: 05/29/17  5:39 AM  Result Value Ref Range   RPR Ser Ql Non Reactive Non Reactive  HIV antibody   Collection Time: 05/29/17  5:39 AM  Result Value Ref Range   HIV Screen 4th Generation wRfx Non Reactive Non Reactive  Type and screen [facility] [place]   Collection Time: 05/29/17  5:50 AM  Result Value Ref Range   ABO/RH(D) A POS    Antibody Screen NEG    Sample Expiration      06/01/2017 Performed at [facility], [address], [place], [place] [address]     Medications:  Scheduled . prenatal vitamin w/FE, FA  1 tablet Oral Q1200  . sodium chloride flush  3 mL Intravenous Q12H   I have reviewed the patient's current medications.  ASSESSMENT: Patient Active Problem List   Diagnosis Date Noted  . Bradycardia 05/25/2017  . Vaginal bleeding in pregnancy 05/19/2017  . ?PPROM on 4/29 05/19/2017  . Malpresentation before onset of labor 05/19/2017  . Previous cesarean section 05/01/2017  . UTI (urinary tract infection) during pregnancy, first trimester 05/01/2017  . Supervision of high-risk pregnancy 02/05/2017  . Smoker 02/05/2017  . Anxiety 01/20/2017  . History of preterm delivery, currently pregnant 01/08/2017  . Hx of preeclampsia, prior pregnancy, currently pregnant 01/08/2017    PLAN: Still no bleeding since 5/7 night, this is so reassuring Repeat scan on 05/26/17 showed normal AFI, this is reassuring. S/p latency antibiotics.  Reevaluate on 06/02/17. Will repeat Amnisure today; may be positive given old blood but if negative this will be reassuring. S/p course of BMZ 4/29-30 and magnesium sulfate; appreciate NICU consult Deliver for fetal or maternal complications; has previous cesarean section and breech presentation. Check CBC, T&S q72 hours. Third trimester labs normal; GTT to be done on 06/01/17.  Continues to need  inpatient care given chronic bleeding and equivocal PPROM status; patient is aware Continue routine antenatal care  [name], MD 05/30/2017,9:12 AM

## 2017-05-31 LAB — COMPREHENSIVE METABOLIC PANEL
ALBUMIN: 2.6 g/dL — AB (ref 3.5–5.0)
ALK PHOS: 74 U/L (ref 38–126)
ALT: 12 U/L — ABNORMAL LOW (ref 14–54)
AST: 17 U/L (ref 15–41)
Anion gap: 10 (ref 5–15)
BUN: 14 mg/dL (ref 6–20)
CALCIUM: 9.4 mg/dL (ref 8.9–10.3)
CO2: 20 mmol/L — ABNORMAL LOW (ref 22–32)
CREATININE: 0.56 mg/dL (ref 0.44–1.00)
Chloride: 103 mmol/L (ref 101–111)
GFR calc Af Amer: >60 mL/min (ref >60)
GLUCOSE: 79 mg/dL (ref 65–99)
Potassium: 4.3 mmol/L (ref 3.5–5.1)
Sodium: 133 mmol/L — ABNORMAL LOW (ref 135–145)
TOTAL PROTEIN: 6 g/dL — AB (ref 6.5–8.1)
Total Bilirubin: 0.4 mg/dL (ref 0.3–1.2)

## 2017-05-31 LAB — CBC
HEMATOCRIT: 32.3 % — AB (ref 36.0–46.0)
HEMOGLOBIN: 10.7 g/dL — AB (ref 12.0–15.0)
MCH: 31.1 pg (ref 26.0–34.0)
MCHC: 33.1 g/dL (ref 30.0–36.0)
MCV: 93.9 fL (ref 78.0–100.0)
Platelets: 269 10*3/uL (ref 150–400)
RBC: 3.44 MIL/uL — ABNORMAL LOW (ref 3.87–5.11)
RDW: 13.1 % (ref 11.5–15.5)
WBC: 20.1 10*3/uL — AB (ref 4.0–10.5)

## 2017-05-31 LAB — PROTEIN / CREATININE RATIO, URINE
CREATININE, URINE: 42 mg/dL
Total Protein, Urine: <6 mg/dL

## 2017-05-31 NOTE — Progress Notes (Signed)
FACULTY PRACTICE ANTEPARTUM PROGRESS NOTE  Kimberly Foster is a 27 y.o. G2P0101 at [redacted]w[redacted]d who is admitted for chronic abruption, vaginal bleeding.  Estimated Date of Delivery: 08/25/17 Fetal presentation is breech.  Length of Stay:  12 Days. Admitted 05/19/2017  Subjective: Patient reports normal fetal movement.  She denies uterine contractions, denies leaking of fluid per vagina. Reports small amount brownish discharge with wiping. Denies headache, vision changes, abdominal pain.  Vitals:  Blood pressure 118/72, pulse 69, temperature 97.9 F (36.6 C), temperature source Oral, resp. rate 16, height  (1.549 m), weight 139 lb 4 oz (63.2 kg), last menstrual period 11/18/2016, SpO2 99 %. Physical Examination: CONSTITUTIONAL: Well-developed, well-nourished female in no acute distress.  HENT:  Normocephalic, atraumatic, External right and left ear normal. Oropharynx is clear and moist EYES: Conjunctivae and EOM are normal. Pupils are equal, round, and reactive to light. No scleral icterus.  NECK: Normal range of motion, supple, no masses. SKIN: Skin is warm and dry. No rash noted. Not diaphoretic. No erythema. No pallor. NEUROLGIC: Alert and oriented to person, place, and time. Normal reflexes, muscle tone coordination. No cranial nerve deficit noted. PSYCHIATRIC: Normal mood and affect. Normal behavior. Normal judgment and thought content. CARDIOVASCULAR: Normal heart rate noted, regular rhythm RESPIRATORY: Effort normal, no problems with respiration noted MUSCULOSKELETAL: Normal range of motion. No edema and no tenderness. ABDOMEN: Soft, nontender, nondistended, gravid. CERVIX: deferred  Fetal monitoring: FHR: 150s bpm, Variability: moderate, Accelerations: not present, Decelerations: Absent  Uterine activity: none contractions per hour   No results found.  Current scheduled medications . prenatal vitamin w/FE, FA  1 tablet Oral Q1200  . sodium chloride flush  3 mL Intravenous Q12H     I have reviewed the patient's current medications.  ASSESSMENT: Principal Problem:   Vaginal bleeding in pregnancy Active Problems:   History of preterm delivery, currently pregnant   Hx of preeclampsia, prior pregnancy, currently pregnant   Supervision of high-risk pregnancy   Smoker   Previous cesarean section   ?PPROM on 4/29   Malpresentation before onset of labor   Bradycardia   PLAN: Still with only brown spotting with wiping, no active bleeding since 5/7 night BP mildly elevated yesterday and this am, PEC labs sent Normal AFI 05/26/17  S/p latency antibiotics Amnisure yesterday again positive, possibly due to old blood Will repeat AFI 06/02/17 S/p course of BMZ 4/29-30 and magnesium sulfate; appreciate NICU consult Deliver for fetal or maternal complications; has previous cesarean section and breech presentation.  CBC, T&S q72 hours.  GTT to be done on 06/01/17.  Continues to need inpatient care given chronic bleeding and equivocal PPROM status; patient is aware   Continue routine antenatal care.   Baldemar Lenis, M.D. Attending Obstetrician & Gynecologist, [facility] for [employer], [facility] 05/31/17 6:12 AM

## 2017-06-01 LAB — CBC
HEMATOCRIT: 32 % — AB (ref 36.0–46.0)
HEMOGLOBIN: 10.7 g/dL — AB (ref 12.0–15.0)
MCH: 31.5 pg (ref 26.0–34.0)
MCHC: 33.4 g/dL (ref 30.0–36.0)
MCV: 94.1 fL (ref 78.0–100.0)
PLATELETS: 270 10*3/uL (ref 150–400)
RBC: 3.4 MIL/uL — ABNORMAL LOW (ref 3.87–5.11)
RDW: 13.1 % (ref 11.5–15.5)
WBC: 20.3 10*3/uL — ABNORMAL HIGH (ref 4.0–10.5)

## 2017-06-01 LAB — GLUCOSE, FASTING GESTATIONAL: Glucose Tolerance, Fasting: 88 mg/dL

## 2017-06-01 LAB — GLUCOSE, 2 HOUR GESTATIONAL: Glucose Tolerance, 2 hour: 125 mg/dL (ref 70–164)

## 2017-06-01 LAB — TYPE AND SCREEN
ABO/RH(D): A POS
Antibody Screen: NEGATIVE

## 2017-06-01 LAB — GLUCOSE, 1 HOUR GESTATIONAL: Glucose Tolerance, 1 hour: 169 mg/dL (ref 70–189)

## 2017-06-01 NOTE — Progress Notes (Signed)
Patient ID: Kimberly Foster, female   DOB: 08/18/1990, 27 y.o.   MRN: [id number] FACULTY PRACTICE ANTEPARTUM(COMPREHENSIVE) NOTE  Kimberly Foster is a 27 y.o. G2P0101 at [redacted]w[redacted]d by best clinical estimate who is admitted for vaginal bleeding in pregnancy.   Fetal presentation is breech. Length of Stay:  13  Days  Subjective: Still some dark spotting. No new bleeding since 5/7. Patient reports the fetal movement as active. Patient reports uterine contraction  activity as none. Patient reports  vaginal bleeding as scant staining. Patient describes fluid per vagina as None.  Vitals:  Blood pressure 113/65, pulse 60, temperature 98.6 F (37 C), temperature source Oral, resp. rate 17, height  (1.549 m), weight 139 lb 4 oz (63.2 kg), last menstrual period 11/18/2016, SpO2 99 %. Physical Examination:  General appearance - alert, well appearing, and in no distress Chest - normal effort Abdomen - gravid, non-tender Fundal Height:  size equals dates Extremities: Homans sign is negative, no sign of DVT  Membranes:unknown  Fetal Monitoring:  Baseline: 150 bpm, Variability: Good {> 6 bpm), Accelerations: Non-reactive but appropriate for gestational age and Decelerations: Absent  Labs:  Results for orders placed or performed during the hospital encounter of 05/19/17 (from the past 24 hour(s))  Glucose, fasting gestational   Collection Time: 06/01/17  4:40 AM  Result Value Ref Range   Glucose, Fasting-Gestational 88 mg/dL  CBC   Collection Time: 06/01/17  4:42 AM  Result Value Ref Range   WBC 20.3 (H) 4.0 - 10.5 K/uL   RBC 3.40 (L) 3.87 - 5.11 MIL/uL   Hemoglobin 10.7 (L) 12.0 - 15.0 g/dL   HCT 40.9 (L) 81.1 - 91.4 %   MCV 94.1 78.0 - 100.0 fL   MCH 31.5 26.0 - 34.0 pg   MCHC 33.4 30.0 - 36.0 g/dL   RDW 78.2 95.6 - 21.3 %   Platelets 270 150 - 400 K/uL  Glucose, 1 hour gestational   Collection Time: 06/01/17  6:11 AM  Result Value Ref Range   Glucose, 1 Hour-Gestational 169 70 - 189  mg/dL  Glucose, 2 hour gestational   Collection Time: 06/01/17  7:23 AM  Result Value Ref Range   Glucose, 2 Hour-Gestational 125 70 - 164 mg/dL    Medications:  Scheduled . prenatal vitamin w/FE, FA  1 tablet Oral Q1200  . sodium chloride flush  3 mL Intravenous Q12H   I have reviewed the patient's current medications.  ASSESSMENT: Principal Problem:   Vaginal bleeding in pregnancy Active Problems:   History of preterm delivery, currently pregnant   Hx of preeclampsia, prior pregnancy, currently pregnant   Supervision of high-risk pregnancy   Smoker   Previous cesarean section   ?PPROM on 4/29   Malpresentation before onset of labor   Bradycardia   PLAN: Negative pre-eclampsia labs from yesterday Doing 2 hour today for abnl 1 hour F/u u/s in am with MFM for fluid and possible dye test.  Reva Bores, MD 06/01/2017,8:34 AM

## 2017-06-02 ENCOUNTER — Inpatient Hospital Stay (HOSPITAL_COMMUNITY): Payer: BLUE CROSS/BLUE SHIELD

## 2017-06-02 ENCOUNTER — Encounter (HOSPITAL_COMMUNITY): Payer: Self-pay

## 2017-06-02 NOTE — Progress Notes (Signed)
Patient ID: Kimberly Foster, female   DOB: 03/04/1990, 27 y.o.   MRN: [id number] ACULTY PRACTICE ANTEPARTUM COMPREHENSIVE PROGRESS NOTE  Kimberly Foster is a 27 y.o. G2P0101 at [redacted]w[redacted]d  who is admitted for chronic abruption with vaginal bleeding and possible PROM.Marland Kitchen   Fetal presentation is breech. Length of Stay:  14  Days  Subjective: Pt reports some dark spotting with wiping but no active bleeding. Denies ut ctx or LOF. Reports + FM. Tolerating diet. Up to restroom without problems.     Vitals:  Blood pressure 130/88, pulse 60, temperature 97.8 F (36.6 C), temperature source Oral, resp. rate 16, height  (1.549 m), weight 63.2 kg (139 lb 4 oz), last menstrual period 11/18/2016, SpO2 98 %.   Physical Examination: Lungs clear  Heart RRR Abd soft + BS gravid non tender Ext non tender  Fetal Monitoring: Baseline 150's, no decles x 2 NST's  Labs:  No results found for this or any previous visit (from the past 24 hour(s)).  Imaging Studies:    U/S pending   Medications:  Scheduled . prenatal vitamin w/FE, FA  1 tablet Oral Q1200  . sodium chloride flush  3 mL Intravenous Q12H   I have reviewed the patient's current medications.  ASSESSMENT: IUP 28 weeks Chronic abruption with vaginal bleeding, no active since 05/27/17, s/p BMZ ? PROM, nl AFI on 05/26/17, repeat today H/O PEC, recent PEC labs normal H/O c section   PLAN: Stable, Normal GTT yesterday. U/S today Continue routine antenatal care.   Hermina Staggers 06/02/2017,10:41 AM

## 2017-06-03 ENCOUNTER — Other Ambulatory Visit (HOSPITAL_COMMUNITY): Payer: Self-pay | Admitting: Obstetrics and Gynecology

## 2017-06-03 ENCOUNTER — Inpatient Hospital Stay (HOSPITAL_COMMUNITY): Payer: BLUE CROSS/BLUE SHIELD

## 2017-06-03 DIAGNOSIS — Z3A28 28 weeks gestation of pregnancy: Secondary | ICD-10-CM

## 2017-06-03 DIAGNOSIS — O34219 Maternal care for unspecified type scar from previous cesarean delivery: Secondary | ICD-10-CM

## 2017-06-03 MED ORDER — FLUORESCEIN SODIUM 10 % IV SOLN
500.0000 mg | Freq: Once | INTRAVENOUS | Status: AC
Start: 2017-06-03 — End: 2017-06-03
  Administered 2017-06-03: 500 mg via INTRAVENOUS
  Filled 2017-06-03: qty 5

## 2017-06-03 NOTE — Progress Notes (Signed)
Patient ID: Kimberly Foster, female   DOB: [birth year], 27 y.o.   MRN: [id number] ACULTY PRACTICE ANTEPARTUM COMPREHENSIVE PROGRESS NOTE  Kimberly Foster is a 27 y.o. G2P0101 at [redacted]w[redacted]d  who is admitted for chronic abruption and vaginal bleeding with possible PROM.   Fetal presentation is breech. Length of Stay:  15  Days  Subjective: Pt is without complaints this morning. Reports only some dark brown discharge when she wipes. No active or bright red blood. + FM. No Ut ctx. Tolerating diet. Up to restroom without problems   Vitals:  Blood pressure 140/88, pulse (!) 47, temperature 98.2 F (36.8 C), temperature source Oral, resp. rate 18, height  (1.549 m), weight 63.2 kg (139 lb 4 oz), last menstrual period 11/18/2016, SpO2 100 %.   Physical Examination: Lungs clear Heart RRR Abd soft + BS gravid non tender Ext non tender  Fetal Monitoring:  Baseline 150's + acels   Labs:  No results found for this or any previous visit (from the past 24 hour(s)).  Imaging Studies:    AFI yesterday 7.8   Medications:  Scheduled . prenatal vitamin w/FE, FA  1 tablet Oral Q1200  . sodium chloride flush  3 mL Intravenous Q12H   I have reviewed the patient's current medications.  ASSESSMENT: IUP 28 1/7 weeks Chronic abruption with vaginal bleeding, no active bleeding since 05/27/17, s/p BMZ ? PROM, for amnio dye test today with MFM H/O PEC H/O c section  PLAN: Stable. For amnio dye test today Continue routine antenatal care.   Hermina Staggers 06/03/2017,10:21 AM

## 2017-06-03 NOTE — Progress Notes (Signed)
Tampon was removed and only brownish discharge was seen on it.  There was no orange or yellowish color seen.  Results was called to Dr Adrian Blackwater.

## 2017-06-04 DIAGNOSIS — Z029 Encounter for administrative examinations, unspecified: Secondary | ICD-10-CM

## 2017-06-04 LAB — CBC
HEMATOCRIT: 32 % — AB (ref 36.0–46.0)
Hemoglobin: 10.8 g/dL — ABNORMAL LOW (ref 12.0–15.0)
MCH: 30.9 pg (ref 26.0–34.0)
MCHC: 33.8 g/dL (ref 30.0–36.0)
MCV: 91.7 fL (ref 78.0–100.0)
PLATELETS: 260 10*3/uL (ref 150–400)
RBC: 3.49 MIL/uL — ABNORMAL LOW (ref 3.87–5.11)
RDW: 13.2 % (ref 11.5–15.5)
WBC: 16.7 10*3/uL — AB (ref 4.0–10.5)

## 2017-06-04 LAB — TYPE AND SCREEN
ABO/RH(D): A POS
ANTIBODY SCREEN: NEGATIVE

## 2017-06-04 NOTE — Discharge Summary (Signed)
OB Discharge Summary     Patient Name: Kimberly Foster DOB: [birth year] MRN: [id number]  Date of admission: 05/19/2017 Delivering MD: Pt undelivered  Date of discharge: 06/04/2017  Admitting diagnosis: 26 WEEKS BLEEDING  Intrauterine pregnancy: [redacted]w[redacted]d     Secondary diagnosis:  Principal Problem:   Vaginal bleeding in pregnancy Active Problems:   History of preterm delivery, currently pregnant   Hx of preeclampsia, prior pregnancy, currently pregnant   Supervision of high-risk pregnancy   Smoker   Previous cesarean section   ?PPROM on 4/29   Malpresentation before onset of labor   Bradycardia  Additional problems:      Discharge diagnosis: SAA, PPROM ruled out dye amino dye study                                                                                              Hospital course:  Ms Espe was admitted with vaginal bleeding secondary to chronic abruption. She was monitored for over 7 days with cessation of her vaginal bleeding. She did still have some brownish discharge.  There was a question of PROM by aminosure but this was in the presence of bleeding. AFI could not confirm. She underwent amino with dye injection on 06/03/17. No evidence of dye leakage on tampon after ambulation.  Pt received BMZ 0n 4/29 and 4/30 for FLM. Fetal well being remained reassuring during hospitalization.  Pt was felt amendable for discharge home on 06/04/17. Discharge instructions and follow up were reviewed with pt. She verbalized understanding.  Physical exam  Vitals:   06/03/17 2100 06/03/17 2308 06/04/17 0506 06/04/17 0756  BP:  119/88 127/82 132/88  Pulse:  (!) 58 (!) 58 (!) 47  Resp:  Temp:  98.3 F (36.8 C) 98.1 F (36.7 C) 98.5 F (36.9 C)  TempSrc:  Oral Oral Oral  SpO2: 99% 98% 98% 98%  Weight:      Height:       Lungs clear Heart RRR Abd soft + BS gravid non tender GU deferred Ext non tender Labs: Lab Results  Component Value Date   WBC 16.7 (H)  06/04/2017   HGB 10.8 (L) 06/04/2017   HCT 32.0 (L) 06/04/2017   MCV 91.7 06/04/2017   PLT 260 06/04/2017   CMP Latest Ref Rng & Units 06/01/2017  Glucose mg/dL 88  BUN 6 - 20 mg/dL -  Creatinine 4.09 - 8.11 mg/dL -  Sodium 914 - 782 mmol/L -  Potassium 3.5 - 5.1 mmol/L -  Chloride 101 - 111 mmol/L -  CO2 22 - 32 mmol/L -  Calcium 8.9 - 10.3 mg/dL -  Total Protein 6.5 - 8.1 g/dL -  Total Bilirubin 0.3 - 1.2 mg/dL -  Alkaline Phos 38 - 956 U/L -  AST 15 - 41 U/L -  ALT 14 - 54 U/L -    Discharge instruction: per After Visit Summary and "Baby and Me Booklet".  After visit meds:  Allergies as of 06/04/2017   No Known Allergies     Medication List    STOP taking these medications   aspirin EC  81 MG tablet     TAKE these medications   butalbital-acetaminophen-caffeine 50-325-40 MG tablet Commonly known as:  FIORICET, ESGIC Take 1 tablet by mouth every 6 (six) hours as needed for headache.   famotidine 20 MG tablet Commonly known as:  PEPCID Take 20 mg by mouth as needed for heartburn or indigestion.   PRENATAL GUMMIES/DHA & FA 0.4-32.5 MG Chew Chew 2 tablets by mouth daily.       Diet: routine diet  Activity: Bedrest with BRP and pelvic rest  Outpatient follow up: 1 week with [facility]   06/04/2017 Hermina Staggers, MD

## 2017-06-04 NOTE — Progress Notes (Signed)
Strip reviewed by Dr. Alysia Penna

## 2017-06-12 ENCOUNTER — Encounter (HOSPITAL_COMMUNITY): Payer: Self-pay | Admitting: *Deleted

## 2017-06-12 ENCOUNTER — Inpatient Hospital Stay (HOSPITAL_COMMUNITY): Payer: BLUE CROSS/BLUE SHIELD

## 2017-06-12 ENCOUNTER — Inpatient Hospital Stay (HOSPITAL_COMMUNITY)
Admission: AD | Admit: 2017-06-12 | Discharge: 2017-06-12 | Disposition: A | Discharge status: Home / Self Care | Payer: BLUE CROSS/BLUE SHIELD | Source: Ambulatory Visit | Attending: Obstetrics and Gynecology | Admitting: Obstetrics and Gynecology

## 2017-06-12 ENCOUNTER — Inpatient Hospital Stay (EMERGENCY_DEPARTMENT_HOSPITAL)
Admission: AD | Admit: 2017-06-12 | Discharge: 2017-06-13 | Disposition: A | Discharge status: Home / Self Care | Payer: BLUE CROSS/BLUE SHIELD | Source: Ambulatory Visit | Attending: Obstetrics & Gynecology | Admitting: Obstetrics & Gynecology

## 2017-06-12 ENCOUNTER — Ambulatory Visit (INDEPENDENT_AMBULATORY_CARE_PROVIDER_SITE_OTHER): Payer: BLUE CROSS/BLUE SHIELD | Admitting: Women's Health

## 2017-06-12 ENCOUNTER — Encounter: Payer: Self-pay | Admitting: Women's Health

## 2017-06-12 VITALS — BP 146/92 | HR 90 | Wt 145.0 lb

## 2017-06-12 DIAGNOSIS — O1493 Unspecified pre-eclampsia, third trimester: Secondary | ICD-10-CM | POA: Diagnosis not present

## 2017-06-12 DIAGNOSIS — O9989 Other specified diseases and conditions complicating pregnancy, childbirth and the puerperium: Secondary | ICD-10-CM | POA: Diagnosis not present

## 2017-06-12 DIAGNOSIS — O212 Late vomiting of pregnancy: Secondary | ICD-10-CM | POA: Insufficient documentation

## 2017-06-12 DIAGNOSIS — O210 Mild hyperemesis gravidarum: Secondary | ICD-10-CM | POA: Diagnosis not present

## 2017-06-12 DIAGNOSIS — O34219 Maternal care for unspecified type scar from previous cesarean delivery: Secondary | ICD-10-CM

## 2017-06-12 DIAGNOSIS — O368330 Maternal care for abnormalities of the fetal heart rate or rhythm, third trimester, not applicable or unspecified: Secondary | ICD-10-CM

## 2017-06-12 DIAGNOSIS — R112 Nausea with vomiting, unspecified: Secondary | ICD-10-CM

## 2017-06-12 DIAGNOSIS — O09293 Supervision of pregnancy with other poor reproductive or obstetric history, third trimester: Secondary | ICD-10-CM

## 2017-06-12 DIAGNOSIS — Z3A29 29 weeks gestation of pregnancy: Secondary | ICD-10-CM | POA: Insufficient documentation

## 2017-06-12 DIAGNOSIS — R030 Elevated blood-pressure reading, without diagnosis of hypertension: Secondary | ICD-10-CM

## 2017-06-12 DIAGNOSIS — Z331 Pregnant state, incidental: Secondary | ICD-10-CM

## 2017-06-12 DIAGNOSIS — Z87891 Personal history of nicotine dependence: Secondary | ICD-10-CM

## 2017-06-12 DIAGNOSIS — Z1389 Encounter for screening for other disorder: Secondary | ICD-10-CM

## 2017-06-12 DIAGNOSIS — O1403 Mild to moderate pre-eclampsia, third trimester: Secondary | ICD-10-CM | POA: Diagnosis not present

## 2017-06-12 DIAGNOSIS — Z3483 Encounter for supervision of other normal pregnancy, third trimester: Secondary | ICD-10-CM

## 2017-06-12 DIAGNOSIS — Z98891 History of uterine scar from previous surgery: Secondary | ICD-10-CM

## 2017-06-12 DIAGNOSIS — Z3A28 28 weeks gestation of pregnancy: Secondary | ICD-10-CM | POA: Diagnosis not present

## 2017-06-12 DIAGNOSIS — O368390 Maternal care for abnormalities of the fetal heart rate or rhythm, unspecified trimester, not applicable or unspecified: Secondary | ICD-10-CM

## 2017-06-12 DIAGNOSIS — O288 Other abnormal findings on antenatal screening of mother: Secondary | ICD-10-CM | POA: Diagnosis not present

## 2017-06-12 LAB — COMPREHENSIVE METABOLIC PANEL
ALK PHOS: 86 U/L (ref 38–126)
ALT: 16 U/L (ref 14–54)
ALT: 14 U/L (ref 14–54)
AST: 35 U/L (ref 15–41)
AST: 21 U/L (ref 15–41)
Albumin: 2.9 g/dL — ABNORMAL LOW (ref 3.5–5.0)
Albumin: 2.5 g/dL — ABNORMAL LOW (ref 3.5–5.0)
Alkaline Phosphatase: 99 U/L (ref 38–126)
Anion gap: 11 (ref 5–15)
Anion gap: 11 (ref 5–15)
BUN: 13 mg/dL (ref 6–20)
BUN: 17 mg/dL (ref 6–20)
CALCIUM: 9 mg/dL (ref 8.9–10.3)
CALCIUM: 8 mg/dL — AB (ref 8.9–10.3)
CHLORIDE: 108 mmol/L (ref 101–111)
CO2: 17 mmol/L — AB (ref 22–32)
CO2: 17 mmol/L — AB (ref 22–32)
CREATININE: 0.62 mg/dL (ref 0.44–1.00)
CREATININE: 0.61 mg/dL (ref 0.44–1.00)
Chloride: 107 mmol/L (ref 101–111)
Glucose, Bld: 111 mg/dL — ABNORMAL HIGH (ref 65–99)
Glucose, Bld: 94 mg/dL (ref 65–99)
Potassium: 4.1 mmol/L (ref 3.5–5.1)
Potassium: 4.3 mmol/L (ref 3.5–5.1)
Sodium: 135 mmol/L (ref 135–145)
Sodium: 136 mmol/L (ref 135–145)
TOTAL PROTEIN: 6.9 g/dL (ref 6.5–8.1)
Total Bilirubin: 0.3 mg/dL (ref 0.3–1.2)
Total Bilirubin: 0.4 mg/dL (ref 0.3–1.2)
Total Protein: 6.1 g/dL — ABNORMAL LOW (ref 6.5–8.1)

## 2017-06-12 LAB — CBC
HCT: 34.3 % — ABNORMAL LOW (ref 36.0–46.0)
HCT: 34.3 % — ABNORMAL LOW (ref 36.0–46.0)
HEMOGLOBIN: 11.8 g/dL — AB (ref 12.0–15.0)
HEMOGLOBIN: 11.6 g/dL — AB (ref 12.0–15.0)
MCH: 31.6 pg (ref 26.0–34.0)
MCH: 31 pg (ref 26.0–34.0)
MCHC: 34.4 g/dL (ref 30.0–36.0)
MCHC: 33.8 g/dL (ref 30.0–36.0)
MCV: 91.7 fL (ref 78.0–100.0)
MCV: 91.7 fL (ref 78.0–100.0)
Platelets: 241 10*3/uL (ref 150–400)
Platelets: 216 10*3/uL (ref 150–400)
RBC: 3.74 MIL/uL — AB (ref 3.87–5.11)
RBC: 3.74 MIL/uL — AB (ref 3.87–5.11)
RDW: 13.4 % (ref 11.5–15.5)
RDW: 13.5 % (ref 11.5–15.5)
WBC: 15.4 10*3/uL — ABNORMAL HIGH (ref 4.0–10.5)
WBC: 23.6 10*3/uL — AB (ref 4.0–10.5)

## 2017-06-12 LAB — POCT URINALYSIS DIPSTICK
Blood, UA: NEGATIVE
GLUCOSE UA: NEGATIVE
Ketones, UA: NEGATIVE
Leukocytes, UA: NEGATIVE
Nitrite, UA: NEGATIVE
Protein, UA: POSITIVE — AB

## 2017-06-12 LAB — PROTEIN / CREATININE RATIO, URINE
CREATININE, URINE: 75 mg/dL
Protein Creatinine Ratio: 1.89 mg/mg{Cre} — ABNORMAL HIGH (ref 0.00–0.15)
Total Protein, Urine: 142 mg/dL

## 2017-06-12 MED ORDER — LACTATED RINGERS IV BOLUS
1000.0000 mL | Freq: Once | INTRAVENOUS | Status: AC
  Administered 2017-06-12: 1000 mL via INTRAVENOUS

## 2017-06-12 MED ORDER — ONDANSETRON HCL 4 MG/2ML IJ SOLN
4.0000 mg | Freq: Four times a day (QID) | INTRAMUSCULAR | Status: DC | PRN
  Administered 2017-06-12: 4 mg via INTRAVENOUS
  Filled 2017-06-12: qty 2

## 2017-06-12 NOTE — Progress Notes (Signed)
   LOW-RISK PREGNANCY VISIT Patient name: Kimberly Foster MRN [id number]  Date of birth: Jan 12, 1991 Chief Complaint:   Routine Prenatal Visit  History of Present Illness:   Kimberly Foster is a 27 y.o. G81P0101 female at [redacted]w[redacted]d with an Estimated Date of Delivery: 08/25/17 being seen today for ongoing management of a low-risk pregnancy. She was just d/c'd from hospital on 5/15 after extended stay for chronic abruption, r/o PPROM, had neg amnio/dye injection study. No vb or lof since being d/c'd. Denies ha, visual changes, ruq/epigastric pain, n/v.  Has h/o pre-e w/ c/s @ 31wks. Is not taking baby asa d/t recent vb.  Today she reports no complaints. Contractions: Not present. Vag. Bleeding: None.  Movement: Present. denies leaking of fluid. Review of Systems:   Pertinent items are noted in HPI Denies abnormal vaginal discharge w/ itching/odor/irritation, headaches, visual changes, shortness of breath, chest pain, abdominal pain, severe nausea/vomiting, or problems with urination or bowel movements unless otherwise stated above. Pertinent History Reviewed:  Reviewed past medical,surgical, social, obstetrical and family history.  Reviewed problem list, medications and allergies. Physical Assessment:   Vitals:   06/12/17 1140 06/12/17 1201  BP: (!) 146/98 (!) 146/92  Pulse: 90   Weight: 145 lb (65.8 kg)   Body mass index is 27.4 kg/m.        Physical Examination:   General appearance: Well appearing, and in no distress  Mental status: Alert, oriented to person, place, and time  Skin: Warm & dry  Cardiovascular: Normal heart rate noted  Respiratory: Normal respiratory effort, no distress  Abdomen: Soft, gravid, nontender  Pelvic: Cervical exam deferred         Extremities: Edema: None, DTRs 2+, no clonus  Fetal Status: Fetal Heart Rate (bpm): 152 Fundal Height: 27 cm Movement: Present    Results for orders placed or performed in visit on 06/12/17 (from the past 24 hour(s))  POCT Urinalysis  Dipstick   Collection Time: 06/12/17 11:41 AM  Result Value Ref Range   Color, UA     Clarity, UA     Glucose, UA Negative Negative   Bilirubin, UA     Ketones, UA negative    Spec Grav, UA  1.010 - 1.025   Blood, UA negative    pH, UA  5.0 - 8.0   Protein, UA Positive (A) Negative   Urobilinogen, UA  0.2 or 1.0 E.U./dL   Nitrite, UA neg    Leukocytes, UA Negative Negative   Appearance     Odor      Assessment & Plan:  1) Low-risk pregnancy G2P0101 at [redacted]w[redacted]d with an Estimated Date of Delivery: 08/25/17   2) Elevated bp w/ 3+ proteinuria, h/o pre-e w/ 31wk c/s, to mau for eval, notified Cleone Slim, CNM   Meds: No orders of the defined types were placed in this encounter.  Labs/procedures today: none  Plan:  Continue routine obstetrical care   Reviewed: Preterm labor symptoms and general obstetric precautions including but not limited to vaginal bleeding, contractions, leaking of fluid and fetal movement were reviewed in detail with the patient.  All questions were answered  Follow-up: Return for Tues for LROB.bp check, if d/c'd  Orders Placed This Encounter  Procedures  . POCT Urinalysis Dipstick   Cheral Marker CNM, [facility] 06/12/2017 12:20 PM

## 2017-06-12 NOTE — MAU Note (Signed)
PT SAYS  SHE STARTED VOMITING  AT 6PM .  JUST LEFT  HERE AT 530PM.   . NO H/A.,  HAD BLURRED VISION ON WAY  HERE.  LEFT ARM   FELT NUMB  X 5 MIN-  NOT NOW.

## 2017-06-12 NOTE — Progress Notes (Signed)
Repeat BP  146/92

## 2017-06-12 NOTE — MAU Provider Note (Signed)
Chief Complaint  Patient presents with  . Hypertension     First Provider Initiated Contact with Patient 06/12/17 1345      S: Kimberly Foster  is a 27 y.o. y.o. year old G96P0101 female at [redacted]w[redacted]d weeks gestation who presents to MAU with elevated blood pressures.  Hx Pre-E w/ previous pregnancy. No Hx CHTN. Current blood pressure medication: None.  Recently hospitalized for 2 weeks for vaginal bleeding presumed to be from chronic abruption and concern for PPROM that was ruled out by amnio dye study. Was D/C'd on 06/04/17.  Associated symptoms: Denies Headache, vision changes, epigastric pain Contractions: Denies Vaginal bleeding: Denies Fetal movement: Nml  O:  Patient Vitals for the past 24 hrs:  BP Temp Pulse Resp Height Weight  06/12/17 1430 (!) 141/76 - (!) 52 - - -  06/12/17 1415 140/84 - (!) 55 - - -  06/12/17 1400 (!) 148/84 - (!) 57 - - -  06/12/17 1345 (!) 142/87 - (!) 55 - - -  06/12/17 1328 (!) 149/93 - (!) 59 - - -  06/12/17 1306 (!) 147/104 98.6 F (37 C) 66 18  (1.549 m) 145 lb (65.8 kg)   General: NAD Heart: Regular rate Lungs: Normal rate and effort Abd: Soft, NT, Gravid, S=D Extremities: No Pedal edema Neuro: 2+ deep tendon reflexes, No clonus Pelvic: Deferred     EFM: 150, min variability, 10x10 acceleration x 1, 1 variable deceleration after BPP.  Toco: None  Results for orders placed or performed during the hospital encounter of 06/12/17 (from the past 24 hour(s))  Protein / creatinine ratio, urine     Status: Abnormal   Collection Time: 06/12/17  1:00 PM  Result Value Ref Range   Creatinine, Urine 75.00 mg/dL   Total Protein, Urine 142 mg/dL   Protein Creatinine Ratio 1.89 (H) 0.00 - 0.15 mg/mg[Cre]  CBC     Status: Abnormal   Collection Time: 06/12/17  1:18 PM  Result Value Ref Range   WBC 15.4 (H) 4.0 - 10.5 K/uL   RBC 3.74 (L) 3.87 - 5.11 MIL/uL   Hemoglobin 11.8 (L) 12.0 - 15.0 g/dL   HCT 16.1 (L) 09.6 - 04.5 %   MCV 91.7 78.0 - 100.0  fL   MCH 31.6 26.0 - 34.0 pg   MCHC 34.4 30.0 - 36.0 g/dL   RDW 40.9 81.1 - 91.4 %   Platelets 241 150 - 400 K/uL  Comprehensive metabolic panel     Status: Abnormal   Collection Time: 06/12/17  1:18 PM  Result Value Ref Range   Sodium 135 135 - 145 mmol/L   Potassium 4.1 3.5 - 5.1 mmol/L   Chloride 107 101 - 111 mmol/L   CO2 17 (L) 22 - 32 mmol/L   Glucose, Bld 111 (H) 65 - 99 mg/dL   BUN 13 6 - 20 mg/dL   Creatinine, Ser 7.82 0.44 - 1.00 mg/dL   Calcium 9.0 8.9 - 95.6 mg/dL   Total Protein 6.9 6.5 - 8.1 g/dL   Albumin 2.9 (L) 3.5 - 5.0 g/dL   AST 35 15 - 41 U/L   ALT 16 14 - 54 U/L   Alkaline Phosphatase 99 38 - 126 U/L   Total Bilirubin 0.3 0.3 - 1.2 mg/dL   GFR calc non Af Amer >60 >60 mL/min   GFR calc Af Amer >60 >60 mL/min   Anion gap 11 5 - 15   Discussed Hx, labs, exam, minimal FHR variability and one 10x10  accel w/ Dr. Jolayne Panther. Agrees w/ POC. New orders: BPP.   BPP 8/8. One variable decel after BPP. Discussed w/ Dr. Jolayne Panther. Pt still OK for D/C home since BPP 8/8.     A: [redacted]w[redacted]d week IUP Preeclampsia w/out severe features.  FHR reactive  P: Admit/Discharge home in stable condition Preeclampsia precautions. Follow-up for blood pressure check in 3 days at your doctor's office sooner as needed if symptoms worsen. Return to maternity admissions as needed in emergencies  [place], [place], [place] 06/12/2017 2:46 PM

## 2017-06-12 NOTE — MAU Note (Signed)
Pt had 3+ protein in her urine and elevated b/p 146/92.  Denies any headache or visual changes. Good fetal movement reported.

## 2017-06-12 NOTE — MAU Provider Note (Signed)
None     Chief Complaint:  Emesis   Kimberly Foster is  27 y.o. G2P0101 at [redacted]w[redacted]d presents complaining of Emesis Recently hospitalized for 2 weeks for vaginal bleeding presumed to be from chronic abruption and concern for PPROM that was ruled out by amnio dye study. Was D/C'd on 06/04/17.  Also was seen in MAU today and dx w/preeclampsia w/o severe features.  BPs 140's/90-100, pr cr ratio 1.89, other labs normal.  She was DC'd home at 1730 w/close f/u scheduled outpt.  At 1800, she started vomiting and has vomited >7 times.  Vomiting now. Child has had vomiting  Says had an episode of left arm feeling numb for 5 minutes with some blurred vision.      Had BPP of 8/8 earlier today (done after EFM 150, min variability, 10x10 acceleration x 1, 1 variable deceleration after BPP).    Obstetrical/Gynecological History: OB History    Gravida  2   Para  1   Term  0   Preterm  1   AB      Living  1     SAB      TAB      Ectopic      Multiple      Live Births  1          Past Medical History: Past Medical History:  Diagnosis Date  . Anxiety   . Migraines   . Pre-eclampsia     Past Surgical History: Past Surgical History:  Procedure Laterality Date  . CESAREAN SECTION      Family History: Family History  Problem Relation Age of Onset  . Mesothelioma Paternal Grandfather   . Hypertension Paternal Grandfather   . COPD Paternal Grandfather   . Leukemia Paternal Grandmother   . Hypertension Maternal Grandmother   . Hypertension Maternal Grandfather   . Heart attack Maternal Grandfather   . Heart failure Maternal Grandfather   . Hypertension Father   . Diverticulitis Father   . Hypertension Mother   . Cancer Paternal Uncle   . Arrhythmia Maternal Uncle   . Diabetes Paternal Aunt     Social History: Social History   Tobacco Use  . Smoking status: Former Smoker    Packs/day: 0.50    Types: Cigarettes    Last attempt to quit: 05/19/2017    Years since  quitting: 0.0  . Smokeless tobacco: Never Used  Substance Use Topics  . Alcohol use: No    Frequency: Never  . Drug use: No    Types: Marijuana    Comment: 4 years ago    Allergies: No Known Allergies  Meds:  Medications Prior to Admission  Medication Sig Dispense Refill Last Dose  . aluminum-magnesium hydroxide 200-200 MG/5ML suspension Take 5 mLs by mouth every 6 (six) hours as needed for indigestion.   06/11/2017 at Unknown time  . butalbital-acetaminophen-caffeine (FIORICET, ESGIC) 50-325-40 MG tablet Take 1 tablet by mouth every 6 (six) hours as needed for headache. (Patient not taking: Reported on 06/12/2017) 20 tablet 0 Not Taking at Unknown time  . calcium carbonate (TUMS - DOSED IN MG ELEMENTAL CALCIUM) 500 MG chewable tablet Chew 1-2 tablets by mouth daily.   Past Week at Unknown time  . famotidine (PEPCID) 20 MG tablet Take 20 mg by mouth as needed for heartburn or indigestion.   Past Month at Unknown time  . Prenatal MV-Min-FA-Omega-3 (PRENATAL GUMMIES/DHA & FA) 0.4-32.5 MG CHEW Chew 2 tablets by mouth daily.   06/12/2017  at Unknown time    Review of Systems   Constitutional: Negative for fever and chills Eyes: Negative for visual disturbances Respiratory: Negative for shortness of breath, dyspnea Cardiovascular: Negative for chest pain or palpitations  Gastrointestinal: Negative for diarrhea and constipation Genitourinary: Negative for dysuria and urgency Musculoskeletal: Negative for back pain, joint pain, myalgias.  Normal ROM  Neurological: Negative for dizziness and headaches    Physical Exam  Blood pressure 136/88, pulse (!) 53, temperature 98.5 F (36.9 C), temperature source Oral, resp. rate 20, height  (1.549 m), weight 65 kg (143 lb 4 oz), last menstrual period 11/18/2016. GENERAL: Well-developed, well-nourished female in no acute distress.  LUNGS: Clear to auscultation bilaterally.  HEART: Regular rate and rhythm. ABDOMEN: Soft, nontender,  nondistended, gravid.  EXTREMITIES: Nontender, no edema, 2+ distal pulses. DTR's 2+ FHT:  Baseline rate 145 bpm   Variability moderate  Accelerations 10X10   Decelerations occ mild variable Contractions: Every 0 mins   Labs: Results for orders placed or performed during the hospital encounter of 06/12/17 (from the past 24 hour(s))  CBC     Status: Abnormal   Collection Time: 06/12/17 11:06 PM  Result Value Ref Range   WBC 23.6 (H) 4.0 - 10.5 K/uL   RBC 3.74 (L) 3.87 - 5.11 MIL/uL   Hemoglobin 11.6 (L) 12.0 - 15.0 g/dL   HCT 16.1 (L) 09.6 - 04.5 %   MCV 91.7 78.0 - 100.0 fL   MCH 31.0 26.0 - 34.0 pg   MCHC 33.8 30.0 - 36.0 g/dL   RDW 40.9 81.1 - 91.4 %   Platelets 216 150 - 400 K/uL  Comprehensive metabolic panel     Status: Abnormal   Collection Time: 06/12/17 11:06 PM  Result Value Ref Range   Sodium 136 135 - 145 mmol/L   Potassium 4.3 3.5 - 5.1 mmol/L   Chloride 108 101 - 111 mmol/L   CO2 17 (L) 22 - 32 mmol/L   Glucose, Bld 94 65 - 99 mg/dL   BUN 17 6 - 20 mg/dL   Creatinine, Ser 7.82 0.44 - 1.00 mg/dL   Calcium 8.0 (L) 8.9 - 10.3 mg/dL   Total Protein 6.1 (L) 6.5 - 8.1 g/dL   Albumin 2.5 (L) 3.5 - 5.0 g/dL   AST 21 15 - 41 U/L   ALT 14 14 - 54 U/L   Alkaline Phosphatase 86 38 - 126 U/L   Total Bilirubin 0.4 0.3 - 1.2 mg/dL   GFR calc non Af Amer >60 >60 mL/min   GFR calc Af Amer >60 >60 mL/min   Anion gap 11 5 - 15    Pt feels much better after IVF/zofran Plans to get BP cuff F/U MAU on Saturday   Assessment: Kimberly Foster is  27 y.o. G2P0101 at [redacted]w[redacted]d presents with N/V, mild preeclampsia, stable.  Plan: Discussed POC w/Dr.Anyanwu  Pt stable for DC w/severe preeclampsia precautions given.  Rx zofran  Jacklyn Shell 5/24/201912:10 AM

## 2017-06-12 NOTE — Discharge Instructions (Signed)
Preeclampsia and Eclampsia °Preeclampsia is a serious condition that develops only during pregnancy. It is also called toxemia of pregnancy. This condition causes high blood pressure along with other symptoms, such as swelling and headaches. These symptoms may develop as the condition gets worse. Preeclampsia may occur at 20 weeks of pregnancy or later. °Diagnosing and treating preeclampsia early is very important. If not treated early, it can cause serious problems for you and your baby. One problem it can lead to is eclampsia, which is a condition that causes muscle jerking or shaking (convulsions or seizures) in the mother. Delivering your baby is the best treatment for preeclampsia or eclampsia. Preeclampsia and eclampsia symptoms usually go away after your baby is born. °What are the causes? °The cause of preeclampsia is not known. °What increases the risk? °The following risk factors make you more likely to develop preeclampsia: °· Being pregnant for the first time. °· Having had preeclampsia during a past pregnancy. °· Having a family history of preeclampsia. °· Having high blood pressure. °· Being pregnant with twins or triplets. °· Being 35 or older. °· Being African-American. °· Having kidney disease or diabetes. °· Having medical conditions such as lupus or blood diseases. °· Being very overweight (obese). ° °What are the signs or symptoms? °The earliest signs of preeclampsia are: °· High blood pressure. °· Increased protein in your urine. Your health care provider will check for this at every visit before you give birth (prenatal visit). ° °Other symptoms that may develop as the condition gets worse include: °· Severe headaches. °· Sudden weight gain. °· Swelling of the hands, face, legs, and feet. °· Nausea and vomiting. °· Vision problems, such as blurred or double vision. °· Numbness in the face, arms, legs, and feet. °· Urinating less than usual. °· Dizziness. °· Slurred speech. °· Abdominal pain,  especially upper abdominal pain. °· Convulsions or seizures. ° °Symptoms generally go away after giving birth. °How is this diagnosed? °There are no screening tests for preeclampsia. Your health care provider will ask you about symptoms and check for signs of preeclampsia during your prenatal visits. You may also have tests that include: °· Urine tests. °· Blood tests. °· Checking your blood pressure. °· Monitoring your baby’s heart rate. °· Ultrasound. ° °How is this treated? °You and your health care provider will determine the treatment approach that is best for you. Treatment may include: °· Having more frequent prenatal exams to check for signs of preeclampsia, if you have an increased risk for preeclampsia. °· Bed rest. °· Reducing how much salt (sodium) you eat. °· Medicine to lower your blood pressure. °· Staying in the hospital, if your condition is severe. There, treatment will focus on controlling your blood pressure and the amount of fluids in your body (fluid retention). °· You may need to take medicine (magnesium sulfate) to prevent seizures. This medicine may be given as an injection or through an IV tube. °· Delivering your baby early, if your condition gets worse. You may have your labor started with medicine (induced), or you may have a cesarean delivery. ° °Follow these instructions at home: °Eating and drinking ° °· Drink enough fluid to keep your urine clear or pale yellow. °· Eat a healthy diet that is low in sodium. Do not add salt to your food. Check nutrition labels to see how much sodium a food or beverage contains. °· Avoid caffeine. °Lifestyle °· Do not use any products that contain nicotine or tobacco, such as cigarettes   and e-cigarettes. If you need help quitting, ask your health care provider. °· Do not use alcohol or drugs. °· Avoid stress as much as possible. Rest and get plenty of sleep. °General instructions °· Take over-the-counter and prescription medicines only as told by your  health care provider. °· When lying down, lie on your side. This keeps pressure off of your baby. °· When sitting or lying down, raise (elevate) your feet. Try putting some pillows underneath your lower legs. °· Exercise regularly. Ask your health care provider what kinds of exercise are best for you. °· Keep all follow-up and prenatal visits as told by your health care provider. This is important. °How is this prevented? °To prevent preeclampsia or eclampsia from developing during another pregnancy: °· Get proper medical care during pregnancy. Your health care provider may be able to prevent preeclampsia or diagnose and treat it early. °· Your health care provider may have you take a low-dose aspirin or a calcium supplement during your next pregnancy. °· You may have tests of your blood pressure and kidney function after giving birth. °· Maintain a healthy weight. Ask your health care provider for help managing weight gain during pregnancy. °· Work with your health care provider to manage any long-term (chronic) health conditions you have, such as diabetes or kidney problems. ° °Contact a health care provider if: °· You gain more weight than expected. °· You have headaches. °· You have nausea or vomiting. °· You have abdominal pain. °· You feel dizzy or light-headed. °Get help right away if: °· You develop sudden or severe swelling anywhere in your body. This usually happens in the legs. °· You gain 5 lbs (2.3 kg) or more during one week. °· You have severe: °? Abdominal pain. °? Headaches. °? Dizziness. °? Vision problems. °? Confusion. °? Nausea or vomiting. °· You have a seizure. °· You have trouble moving any part of your body. °· You develop numbness in any part of your body. °· You have trouble speaking. °· You have any abnormal bleeding. °· You pass out. °This information is not intended to replace advice given to you by your health care provider. Make sure you discuss any questions you have with your health  care provider. °Document Released: 01/05/2000 Document Revised: 09/05/2015 Document Reviewed: 08/14/2015 °[employer] Interactive Patient Education © 2018 [employer] ° °

## 2017-06-12 NOTE — Discharge Instructions (Signed)
Hypertension During Pregnancy °Hypertension, commonly called high blood pressure, is when the force of blood pumping through your arteries is too strong. Arteries are blood vessels that carry blood from the heart throughout the body. Hypertension during pregnancy can cause problems for you and your baby. Your baby may be born early (prematurely) or may not weigh as much as he or she should at birth. Very bad cases of hypertension during pregnancy can be life-threatening. °Different types of hypertension can occur during pregnancy. These include: °· Chronic hypertension. This happens when: °? You have hypertension before pregnancy and it continues during pregnancy. °? You develop hypertension before you are [redacted] weeks pregnant, and it continues during pregnancy. °· Gestational hypertension. This is hypertension that develops after the 20th week of pregnancy. °· Preeclampsia, also called toxemia of pregnancy. This is a very serious type of hypertension that develops only during pregnancy. It affects the whole body, and it can be very dangerous for you and your baby. ° °Gestational hypertension and preeclampsia usually go away within 6 weeks after your baby is born. Women who have hypertension during pregnancy have a greater chance of developing hypertension later in life or during future pregnancies. °What are the causes? °The exact cause of hypertension is not known. °What increases the risk? °There are certain factors that make it more likely for you to develop hypertension during pregnancy. These include: °· Having hypertension during a previous pregnancy or prior to pregnancy. °· Being overweight. °· Being older than age 40. °· Being pregnant for the first time or being pregnant with more than one baby. °· Becoming pregnant using fertilization methods such as IVF (in vitro fertilization). °· Having diabetes, kidney problems, or systemic lupus erythematosus. °· Having a family history of hypertension. ° °What are the  signs or symptoms? °Chronic hypertension and gestational hypertension rarely cause symptoms. Preeclampsia causes symptoms, which may include: °· Increased protein in your urine. Your health care provider will check for this at every visit before you give birth (prenatal visit). °· Severe headaches. °· Sudden weight gain. °· Swelling of the hands, face, legs, and feet. °· Nausea and vomiting. °· Vision problems, such as blurred or double vision. °· Numbness in the face, arms, legs, and feet. °· Dizziness. °· Slurred speech. °· Sensitivity to bright lights. °· Abdominal pain. °· Convulsions. ° °How is this diagnosed? °You may be diagnosed with hypertension during a routine prenatal exam. At each prenatal visit, you may: °· Have a urine test to check for high amounts of protein in your urine. °· Have your blood pressure checked. A blood pressure reading is recorded as two numbers, such as "120 over 80" (or 120/80). The first ("top") number is called the systolic pressure. It is a measure of the pressure in your arteries when your heart beats. The second ("bottom") number is called the diastolic pressure. It is a measure of the pressure in your arteries as your heart relaxes between beats. Blood pressure is measured in a unit called mm Hg. A normal blood pressure reading is: °? Systolic: below 120. °? Diastolic: below 80. ° °The type of hypertension that you are diagnosed with depends on your test results and when your symptoms developed. °· Chronic hypertension is usually diagnosed before 20 weeks of pregnancy. °· Gestational hypertension is usually diagnosed after 20 weeks of pregnancy. °· Hypertension with high amounts of protein in the urine is diagnosed as preeclampsia. °· Blood pressure measurements that stay above 160 systolic, or above 110 diastolic, are   signs of severe preeclampsia. ° °How is this treated? °Treatment for hypertension during pregnancy varies depending on the type of hypertension you have and how  serious it is. °· If you take medicines called ACE inhibitors to treat chronic hypertension, you may need to switch medicines. ACE inhibitors should not be taken during pregnancy. °· If you have gestational hypertension, you may need to take blood pressure medicine. °· If you are at risk for preeclampsia, your health care provider may recommend that you take a low-dose aspirin every day to prevent high blood pressure during your pregnancy. °· If you have severe preeclampsia, you may need to be hospitalized so you and your baby can be monitored closely. You may also need to take medicine (magnesium sulfate) to prevent seizures and to lower blood pressure. This medicine may be given as an injection or through an IV tube. °· In some cases, if your condition gets worse, you may need to deliver your baby early. ° °Follow these instructions at home: °Eating and drinking °· Drink enough fluid to keep your urine clear or pale yellow. °· Eat a healthy diet that is low in salt (sodium). Do not add salt to your food. Check food labels to see how much sodium a food or beverage contains. °Lifestyle °· Do not use any products that contain nicotine or tobacco, such as cigarettes and e-cigarettes. If you need help quitting, ask your health care provider. °· Do not use alcohol. °· Avoid caffeine. °· Avoid stress as much as possible. Rest and get plenty of sleep. °General instructions °· Take over-the-counter and prescription medicines only as told by your health care provider. °· While lying down, lie on your left side. This keeps pressure off your baby. °· While sitting or lying down, raise (elevate) your feet. Try putting some pillows under your lower legs. °· Exercise regularly. Ask your health care provider what kinds of exercise are best for you. °· Keep all prenatal and follow-up visits as told by your health care provider. This is important. °Contact a health care provider if: °· You have symptoms that your health care  provider told you may require more treatment or monitoring, such as: °? Fever. °? Vomiting. °? Headache. °Get help right away if: °· You have severe abdominal pain or vomiting that does not get better with treatment. °· You suddenly develop swelling in your hands, ankles, or face. °· You gain 4 lbs (1.8 kg) or more in 1 week. °· You develop vaginal bleeding, or you have blood in your urine. °· You do not feel your baby moving as much as usual. °· You have blurred or double vision. °· You have muscle twitching or sudden tightening (spasms). °· You have shortness of breath. °· Your lips or fingernails turn blue. °This information is not intended to replace advice given to you by your health care provider. Make sure you discuss any questions you have with your health care provider. °Document Released: 09/25/2010 Document Revised: 07/28/2015 Document Reviewed: 06/23/2015 °Elsevier Interactive Patient Education © 2018 [employer] ° °

## 2017-06-13 MED ORDER — ONDANSETRON 4 MG PO TBDP: 4.0000 mg | ORAL_TABLET | Freq: Four times a day (QID) | ORAL | 1 refills | Status: DC | PRN

## 2017-06-14 ENCOUNTER — Inpatient Hospital Stay (EMERGENCY_DEPARTMENT_HOSPITAL)
Admission: AD | Admit: 2017-06-14 | Discharge: 2017-06-14 | Disposition: A | Discharge status: Home / Self Care | Payer: BLUE CROSS/BLUE SHIELD | Source: Ambulatory Visit | Attending: Obstetrics and Gynecology | Admitting: Obstetrics and Gynecology

## 2017-06-14 ENCOUNTER — Other Ambulatory Visit: Payer: Self-pay

## 2017-06-14 DIAGNOSIS — O1410 Severe pre-eclampsia, unspecified trimester: Secondary | ICD-10-CM | POA: Diagnosis not present

## 2017-06-14 DIAGNOSIS — O365930 Maternal care for other known or suspected poor fetal growth, third trimester, not applicable or unspecified: Secondary | ICD-10-CM | POA: Diagnosis not present

## 2017-06-14 DIAGNOSIS — D649 Anemia, unspecified: Secondary | ICD-10-CM | POA: Diagnosis not present

## 2017-06-14 DIAGNOSIS — Z3A29 29 weeks gestation of pregnancy: Secondary | ICD-10-CM | POA: Diagnosis not present

## 2017-06-14 DIAGNOSIS — O1493 Unspecified pre-eclampsia, third trimester: Secondary | ICD-10-CM

## 2017-06-14 DIAGNOSIS — O4103X0 Oligohydramnios, third trimester, not applicable or unspecified: Secondary | ICD-10-CM | POA: Diagnosis not present

## 2017-06-14 DIAGNOSIS — O4593 Premature separation of placenta, unspecified, third trimester: Secondary | ICD-10-CM | POA: Diagnosis not present

## 2017-06-14 DIAGNOSIS — O34211 Maternal care for low transverse scar from previous cesarean delivery: Secondary | ICD-10-CM | POA: Diagnosis not present

## 2017-06-14 DIAGNOSIS — O9902 Anemia complicating childbirth: Secondary | ICD-10-CM | POA: Diagnosis not present

## 2017-06-14 DIAGNOSIS — O1414 Severe pre-eclampsia complicating childbirth: Secondary | ICD-10-CM | POA: Diagnosis not present

## 2017-06-14 DIAGNOSIS — Z87891 Personal history of nicotine dependence: Secondary | ICD-10-CM | POA: Diagnosis not present

## 2017-06-14 DIAGNOSIS — Z3A30 30 weeks gestation of pregnancy: Secondary | ICD-10-CM | POA: Diagnosis not present

## 2017-06-14 DIAGNOSIS — O1002 Pre-existing essential hypertension complicating childbirth: Secondary | ICD-10-CM | POA: Diagnosis not present

## 2017-06-14 DIAGNOSIS — Z302 Encounter for sterilization: Secondary | ICD-10-CM | POA: Diagnosis not present

## 2017-06-14 DIAGNOSIS — O113 Pre-existing hypertension with pre-eclampsia, third trimester: Secondary | ICD-10-CM | POA: Diagnosis not present

## 2017-06-14 DIAGNOSIS — O114 Pre-existing hypertension with pre-eclampsia, complicating childbirth: Secondary | ICD-10-CM | POA: Diagnosis not present

## 2017-06-14 DIAGNOSIS — O1413 Severe pre-eclampsia, third trimester: Secondary | ICD-10-CM | POA: Diagnosis not present

## 2017-06-14 DIAGNOSIS — R030 Elevated blood-pressure reading, without diagnosis of hypertension: Secondary | ICD-10-CM | POA: Diagnosis not present

## 2017-06-14 MED ORDER — LABETALOL HCL 200 MG PO TABS: 200.0000 mg | ORAL_TABLET | Freq: Two times a day (BID) | ORAL | 3 refills | Status: DC

## 2017-06-14 NOTE — MAU Provider Note (Signed)
Patient Kimberly Foster  Is a 27 y.o. G2P0101 At [redacted]w[redacted]d here for a BP follow-up after having elevated pressures at her office visit on 06-12-2017. She was diagnosed with mild-pre e and asked to follow up with MAU for a BP check on 06-14-2017.   She denies blurry vision, HA, vaginal discharge, bleeding, no other ob-gyn complaints.  History     CSN: [id number]  Arrival date and time: 06/14/17 1241   None     Chief Complaint  Patient presents with  . Blood Pressure Check   Hypertension  This is a new problem. The current episode started in the past 7 days. The problem is unchanged. Pertinent negatives include no anxiety, blurred vision, chest pain, PND or shortness of breath. There are no associated agents to hypertension.   Patient is recently diagnosed with pre-eclampsia without severe features (proteinuria).  OB History    Gravida  2   Para  1   Term  0   Preterm  1   AB      Living  1     SAB      TAB      Ectopic      Multiple      Live Births  1           Past Medical History:  Diagnosis Date  . Anxiety   . Migraines   . Pre-eclampsia     Past Surgical History:  Procedure Laterality Date  . CESAREAN SECTION      Family History  Problem Relation Age of Onset  . Mesothelioma Paternal Grandfather   . Hypertension Paternal Grandfather   . COPD Paternal Grandfather   . Leukemia Paternal Grandmother   . Hypertension Maternal Grandmother   . Hypertension Maternal Grandfather   . Heart attack Maternal Grandfather   . Heart failure Maternal Grandfather   . Hypertension Father   . Diverticulitis Father   . Hypertension Mother   . Cancer Paternal Uncle   . Arrhythmia Maternal Uncle   . Diabetes Paternal Aunt     Social History   Tobacco Use  . Smoking status: Former Smoker    Packs/day: 0.50    Types: Cigarettes    Last attempt to quit: 05/19/2017    Years since quitting: 0.0  . Smokeless tobacco: Never Used  Substance Use Topics  . Alcohol  use: No    Frequency: Never  . Drug use: No    Types: Marijuana    Comment: 4 years ago    Allergies: No Known Allergies  Medications Prior to Admission  Medication Sig Dispense Refill Last Dose  . aluminum-magnesium hydroxide 200-200 MG/5ML suspension Take 5 mLs by mouth every 6 (six) hours as needed for indigestion.   06/11/2017 at Unknown time  . butalbital-acetaminophen-caffeine (FIORICET, ESGIC) 50-325-40 MG tablet Take 1 tablet by mouth every 6 (six) hours as needed for headache. (Patient not taking: Reported on 06/12/2017) 20 tablet 0 Not Taking at Unknown time  . calcium carbonate (TUMS - DOSED IN MG ELEMENTAL CALCIUM) 500 MG chewable tablet Chew 1-2 tablets by mouth daily.   Past Week at Unknown time  . famotidine (PEPCID) 20 MG tablet Take 20 mg by mouth as needed for heartburn or indigestion.   Past Month at Unknown time  . ondansetron (ZOFRAN-ODT) 4 MG disintegrating tablet Take 1 tablet (4 mg total) by mouth every 6 (six) hours as needed for nausea or vomiting. 30 tablet 1   . Prenatal MV-Min-FA-Omega-3 (PRENATAL GUMMIES/DHA &  FA) 0.4-32.5 MG CHEW Chew 2 tablets by mouth daily.   06/12/2017 at Unknown time    Review of Systems  Eyes: Negative for blurred vision.  Respiratory: Negative for shortness of breath.   Cardiovascular: Negative for chest pain and PND.   Physical Exam   Blood pressure (!) 149/89, pulse 64, temperature 98.3 F (36.8 C), temperature source Oral, resp. rate 17, weight 145 lb (65.8 kg), last menstrual period 11/18/2016, SpO2 100 %.  Physical Exam  Constitutional: She is oriented to person, place, and time. She appears well-developed and well-nourished.  HENT:  Head: Normocephalic.  Respiratory: Effort normal.  GI: Soft.  Musculoskeletal: Normal range of motion.  Neurological: She is alert and oriented to person, place, and time.  Skin: Skin is warm and dry.  Psychiatric: She has a normal mood and affect.    MAU Course  Procedures  MDM Vitals:    06/14/17 1253 06/14/17 1308  BP: (!) 148/92 (!) 149/89  Pulse: 64   Resp: 17   Temp: 98.3 F (36.8 C)   SpO2: 100%    Patient without signs of severe features today. Discussed with Dr. Alysia Penna, who recommends patient begin labetalol 200 mg BID.  Assessment and Plan   1. Pre-eclampsia in third trimester    2. Start labetalol 200 mg twice a day.   3. Keep appt at [facility] on 06-17-2017.   4. Return to MAU if she developes HA, blurry vision, sudden swelling, epigastric pain or other ob-gyn complaint.   Charlesetta Garibaldi Voris Tigert 06/14/2017, 1:18 PM

## 2017-06-14 NOTE — MAU Note (Signed)
Told to come in for BP check. Feeling good, denies HA, vision changes, epigastric  Pain , or increase in swelling.

## 2017-06-14 NOTE — Discharge Instructions (Signed)

## 2017-06-15 ENCOUNTER — Other Ambulatory Visit: Payer: Self-pay

## 2017-06-15 ENCOUNTER — Inpatient Hospital Stay (HOSPITAL_COMMUNITY)
Admission: AD | Admit: 2017-06-15 | Discharge: 2017-06-20 | DRG: 784 | Disposition: A | Discharge status: Home / Self Care | Payer: BLUE CROSS/BLUE SHIELD | Attending: Obstetrics and Gynecology | Admitting: Obstetrics and Gynecology

## 2017-06-15 ENCOUNTER — Encounter (HOSPITAL_COMMUNITY): Payer: Self-pay | Admitting: *Deleted

## 2017-06-15 DIAGNOSIS — O4103X0 Oligohydramnios, third trimester, not applicable or unspecified: Secondary | ICD-10-CM | POA: Diagnosis not present

## 2017-06-15 DIAGNOSIS — O1413 Severe pre-eclampsia, third trimester: Secondary | ICD-10-CM | POA: Diagnosis not present

## 2017-06-15 DIAGNOSIS — O114 Pre-existing hypertension with pre-eclampsia, complicating childbirth: Secondary | ICD-10-CM | POA: Diagnosis present

## 2017-06-15 DIAGNOSIS — O9902 Anemia complicating childbirth: Secondary | ICD-10-CM | POA: Diagnosis present

## 2017-06-15 DIAGNOSIS — O09899 Supervision of other high risk pregnancies, unspecified trimester: Secondary | ICD-10-CM

## 2017-06-15 DIAGNOSIS — O09299 Supervision of pregnancy with other poor reproductive or obstetric history, unspecified trimester: Secondary | ICD-10-CM

## 2017-06-15 DIAGNOSIS — O1002 Pre-existing essential hypertension complicating childbirth: Secondary | ICD-10-CM | POA: Diagnosis present

## 2017-06-15 DIAGNOSIS — O09219 Supervision of pregnancy with history of pre-term labor, unspecified trimester: Secondary | ICD-10-CM

## 2017-06-15 DIAGNOSIS — O113 Pre-existing hypertension with pre-eclampsia, third trimester: Secondary | ICD-10-CM | POA: Diagnosis not present

## 2017-06-15 DIAGNOSIS — D649 Anemia, unspecified: Secondary | ICD-10-CM | POA: Diagnosis present

## 2017-06-15 DIAGNOSIS — R030 Elevated blood-pressure reading, without diagnosis of hypertension: Secondary | ICD-10-CM | POA: Diagnosis present

## 2017-06-15 DIAGNOSIS — O4593 Premature separation of placenta, unspecified, third trimester: Secondary | ICD-10-CM | POA: Diagnosis present

## 2017-06-15 DIAGNOSIS — O119 Pre-existing hypertension with pre-eclampsia, unspecified trimester: Secondary | ICD-10-CM

## 2017-06-15 DIAGNOSIS — O1493 Unspecified pre-eclampsia, third trimester: Secondary | ICD-10-CM

## 2017-06-15 DIAGNOSIS — Z87891 Personal history of nicotine dependence: Secondary | ICD-10-CM | POA: Diagnosis not present

## 2017-06-15 DIAGNOSIS — Z3A30 30 weeks gestation of pregnancy: Secondary | ICD-10-CM

## 2017-06-15 DIAGNOSIS — Z3A29 29 weeks gestation of pregnancy: Secondary | ICD-10-CM

## 2017-06-15 DIAGNOSIS — Z302 Encounter for sterilization: Secondary | ICD-10-CM

## 2017-06-15 DIAGNOSIS — O1490 Unspecified pre-eclampsia, unspecified trimester: Secondary | ICD-10-CM

## 2017-06-15 DIAGNOSIS — O1414 Severe pre-eclampsia complicating childbirth: Secondary | ICD-10-CM | POA: Diagnosis not present

## 2017-06-15 DIAGNOSIS — O365930 Maternal care for other known or suspected poor fetal growth, third trimester, not applicable or unspecified: Secondary | ICD-10-CM

## 2017-06-15 DIAGNOSIS — O34211 Maternal care for low transverse scar from previous cesarean delivery: Secondary | ICD-10-CM | POA: Diagnosis not present

## 2017-06-15 DIAGNOSIS — O1410 Severe pre-eclampsia, unspecified trimester: Secondary | ICD-10-CM | POA: Diagnosis not present

## 2017-06-15 DIAGNOSIS — Z98891 History of uterine scar from previous surgery: Secondary | ICD-10-CM

## 2017-06-15 DIAGNOSIS — O34219 Maternal care for unspecified type scar from previous cesarean delivery: Secondary | ICD-10-CM

## 2017-06-15 DIAGNOSIS — Z654 Victim of crime and terrorism: Secondary | ICD-10-CM

## 2017-06-15 LAB — RAPID URINE DRUG SCREEN, HOSP PERFORMED
Amphetamines: NOT DETECTED
BARBITURATES: NOT DETECTED
Benzodiazepines: NOT DETECTED
COCAINE: NOT DETECTED
Opiates: NOT DETECTED
TETRAHYDROCANNABINOL: NOT DETECTED

## 2017-06-15 LAB — TYPE AND SCREEN
ABO/RH(D): A POS
ANTIBODY SCREEN: NEGATIVE

## 2017-06-15 LAB — CBC
HEMATOCRIT: 33.8 % — AB (ref 36.0–46.0)
HEMATOCRIT: 34.3 % — AB (ref 36.0–46.0)
HEMOGLOBIN: 11.3 g/dL — AB (ref 12.0–15.0)
HEMOGLOBIN: 11.6 g/dL — AB (ref 12.0–15.0)
MCH: 30.6 pg (ref 26.0–34.0)
MCH: 31 pg (ref 26.0–34.0)
MCHC: 33.4 g/dL (ref 30.0–36.0)
MCHC: 33.8 g/dL (ref 30.0–36.0)
MCV: 91.6 fL (ref 78.0–100.0)
MCV: 91.7 fL (ref 78.0–100.0)
Platelets: 223 10*3/uL (ref 150–400)
Platelets: 228 10*3/uL (ref 150–400)
RBC: 3.69 MIL/uL — AB (ref 3.87–5.11)
RBC: 3.74 MIL/uL — ABNORMAL LOW (ref 3.87–5.11)
RDW: 13.5 % (ref 11.5–15.5)
RDW: 13.4 % (ref 11.5–15.5)
WBC: 10.9 10*3/uL — ABNORMAL HIGH (ref 4.0–10.5)
WBC: 14.3 10*3/uL — ABNORMAL HIGH (ref 4.0–10.5)

## 2017-06-15 LAB — COMPREHENSIVE METABOLIC PANEL
ALBUMIN: 2.4 g/dL — AB (ref 3.5–5.0)
ALT: 27 U/L (ref 14–54)
ANION GAP: 11 (ref 5–15)
AST: 40 U/L (ref 15–41)
Alkaline Phosphatase: 97 U/L (ref 38–126)
BILIRUBIN TOTAL: 0.3 mg/dL (ref 0.3–1.2)
BUN: 11 mg/dL (ref 6–20)
CHLORIDE: 102 mmol/L (ref 101–111)
CO2: 20 mmol/L — AB (ref 22–32)
Calcium: 7.8 mg/dL — ABNORMAL LOW (ref 8.9–10.3)
Creatinine, Ser: 0.51 mg/dL (ref 0.44–1.00)
GFR calc Af Amer: >60 mL/min (ref >60)
GFR calc non Af Amer: >60 mL/min (ref >60)
GLUCOSE: 105 mg/dL — AB (ref 65–99)
POTASSIUM: 3.8 mmol/L (ref 3.5–5.1)
SODIUM: 133 mmol/L — AB (ref 135–145)
TOTAL PROTEIN: 5.6 g/dL — AB (ref 6.5–8.1)

## 2017-06-15 LAB — COMPREHENSIVE METABOLIC PANEL WITH GFR
ALT: 16 U/L (ref 14–54)
AST: 22 U/L (ref 15–41)
Albumin: 2.6 g/dL — ABNORMAL LOW (ref 3.5–5.0)
Alkaline Phosphatase: 99 U/L (ref 38–126)
Anion gap: 11 (ref 5–15)
BUN: 15 mg/dL (ref 6–20)
CO2: 19 mmol/L — ABNORMAL LOW (ref 22–32)
Calcium: 9.1 mg/dL (ref 8.9–10.3)
Chloride: 106 mmol/L (ref 101–111)
Creatinine, Ser: 0.54 mg/dL (ref 0.44–1.00)
GFR calc Af Amer: >60 mL/min
GFR calc non Af Amer: >60 mL/min
Glucose, Bld: 96 mg/dL (ref 65–99)
Potassium: 4.1 mmol/L (ref 3.5–5.1)
Sodium: 136 mmol/L (ref 135–145)
Total Bilirubin: 0.3 mg/dL (ref 0.3–1.2)
Total Protein: 6.1 g/dL — ABNORMAL LOW (ref 6.5–8.1)

## 2017-06-15 LAB — PROTEIN / CREATININE RATIO, URINE
Creatinine, Urine: 35 mg/dL
Protein Creatinine Ratio: 3.4 mg/mg{Cre} — ABNORMAL HIGH (ref 0.00–0.15)
Total Protein, Urine: 119 mg/dL

## 2017-06-15 LAB — MAGNESIUM: MAGNESIUM: 5.7 mg/dL — AB (ref 1.7–2.4)

## 2017-06-15 MED ORDER — MAGNESIUM SULFATE BOLUS VIA INFUSION
4.0000 g | Freq: Once | INTRAVENOUS | Status: AC
  Administered 2017-06-15: 4 g via INTRAVENOUS
  Filled 2017-06-15: qty 500

## 2017-06-15 MED ORDER — NIFEDIPINE 10 MG PO CAPS
10.0000 mg | ORAL_CAPSULE | ORAL | Status: DC | PRN
  Administered 2017-06-15: 10 mg via ORAL
  Administered 2017-06-15: 20 mg via ORAL
  Filled 2017-06-15: qty 1
  Filled 2017-06-15: qty 2

## 2017-06-15 MED ORDER — LACTATED RINGERS IV SOLN
INTRAVENOUS | Status: DC
  Administered 2017-06-15 – 2017-06-17 (×4): via INTRAVENOUS

## 2017-06-15 MED ORDER — MAGNESIUM SULFATE 40 G IN LACTATED RINGERS - SIMPLE
2.0000 g/h | INTRAVENOUS | Status: AC
  Administered 2017-06-15: 1 g/h via INTRAVENOUS
  Filled 2017-06-15 (×2): qty 500

## 2017-06-15 MED ORDER — ONDANSETRON HCL 4 MG/2ML IJ SOLN
4.0000 mg | Freq: Once | INTRAMUSCULAR | Status: AC
Start: 2017-06-15 — End: 2017-06-15
  Administered 2017-06-15: 4 mg via INTRAVENOUS
  Filled 2017-06-15: qty 2

## 2017-06-15 MED ORDER — ZOLPIDEM TARTRATE 5 MG PO TABS: 5.0000 mg | ORAL_TABLET | Freq: Every evening | ORAL | Status: DC | PRN

## 2017-06-15 MED ORDER — LABETALOL HCL 200 MG PO TABS
300.0000 mg | ORAL_TABLET | Freq: Two times a day (BID) | ORAL | Status: DC
  Administered 2017-06-15 – 2017-06-17 (×5): 300 mg via ORAL
  Filled 2017-06-15 (×5): qty 1

## 2017-06-15 MED ORDER — CALCIUM CARBONATE ANTACID 500 MG PO CHEW
2.0000 | CHEWABLE_TABLET | ORAL | Status: DC | PRN
  Administered 2017-06-15 – 2017-06-17 (×5): 400 mg via ORAL
  Filled 2017-06-15 (×5): qty 2

## 2017-06-15 MED ORDER — BETAMETHASONE SOD PHOS & ACET 6 (3-3) MG/ML IJ SUSP
12.0000 mg | INTRAMUSCULAR | Status: AC
  Administered 2017-06-15 – 2017-06-16 (×2): 12 mg via INTRAMUSCULAR
  Filled 2017-06-15 (×2): qty 2

## 2017-06-15 MED ORDER — ONDANSETRON HCL 4 MG/2ML IJ SOLN
4.0000 mg | Freq: Three times a day (TID) | INTRAMUSCULAR | Status: DC | PRN
Start: 2017-06-15 — End: 2017-06-17
  Administered 2017-06-15 – 2017-06-17 (×3): 4 mg via INTRAVENOUS
  Filled 2017-06-15 (×2): qty 2

## 2017-06-15 MED ORDER — PRENATAL MULTIVITAMIN CH
1.0000 | ORAL_TABLET | Freq: Every day | ORAL | Status: DC
  Filled 2017-06-15 (×2): qty 1

## 2017-06-15 MED ORDER — LABETALOL HCL 5 MG/ML IV SOLN: 40.0000 mg | Freq: Once | INTRAVENOUS | Status: DC | PRN

## 2017-06-15 MED ORDER — DOCUSATE SODIUM 100 MG PO CAPS
100.0000 mg | ORAL_CAPSULE | Freq: Every day | ORAL | Status: DC
  Filled 2017-06-15 (×2): qty 1

## 2017-06-15 MED ORDER — PROMETHAZINE HCL 25 MG/ML IJ SOLN
25.0000 mg | Freq: Three times a day (TID) | INTRAMUSCULAR | Status: DC | PRN
  Administered 2017-06-15: 25 mg via INTRAVENOUS
  Filled 2017-06-15: qty 1

## 2017-06-15 MED ORDER — ACETAMINOPHEN 325 MG PO TABS
650.0000 mg | ORAL_TABLET | ORAL | Status: DC | PRN
  Administered 2017-06-15 – 2017-06-17 (×3): 650 mg via ORAL
  Filled 2017-06-15 (×3): qty 2

## 2017-06-15 NOTE — Progress Notes (Signed)
MD notified of pt vomiting x2 and having a 8/10 HA. New orders to decrease Mag from 2g/hr to 1g/hr and Phenergan  IV. Will continue to monitor.

## 2017-06-15 NOTE — MAU Note (Signed)
PT SAYS SHE WAS  IN MAU- FOR RECHECK OF  BP.  -  WAS  OK.  THEN TONIGHT WHILE  SITTING ON PORCH- DECIDED  TO TAKE BP- HIGH.  SO CAME HERE.

## 2017-06-15 NOTE — Progress Notes (Signed)
Patient ID: Kimberly Foster, female   DOB: 1990-08-23, 27 y.o.   MRN: [id number] ACULTY PRACTICE ANTEPARTUM COMPREHENSIVE PROGRESS NOTE  Shahed Yeoman is a 27 y.o. G2P0101 at [redacted]w[redacted]d  who is admitted for preeclampsia with severe features.    Fetal presentation is cephalic. Length of Stay:  0  Days  Subjective: Pt reports that she feels better now that her Magnesium was decreased. She was complaining of nausea and HA on the Magnesium. The magnesium has been decreased and the nausea has improved. Pt does not complain of a HA.    Patient reports good fetal movement.  She reports no uterine contractions, no bleeding and no loss of fluid per vagina.  Vitals:  Blood pressure 139/88, pulse 76, temperature 98 F (36.7 C), temperature source Oral, resp. rate 18, height  (1.549 m), weight 145 lb (65.8 kg), last menstrual period 11/18/2016, SpO2 100 %. Physical Examination: General appearance - alert, well appearing, and in no distress and improved  Fetal Monitoring:  Baseline: 130's bpm, Variability: Absent, Accelerations: absent. Pt is on magnesium and reassuring prior to the magnesium.     and Decelerations: Absent  Labs:  Results for orders placed or performed during the hospital encounter of 06/15/17 (from the past 24 hour(s))  Protein / creatinine ratio, urine   Collection Time: 06/15/17  2:30 AM  Result Value Ref Range   Creatinine, Urine 35.00 mg/dL   Total Protein, Urine 119 mg/dL   Protein Creatinine Ratio 3.40 (H) 0.00 - 0.15 mg/mg[Cre]  Urine rapid drug screen (hosp performed)   Collection Time: 06/15/17  2:30 AM  Result Value Ref Range   Opiates NONE DETECTED NONE DETECTED   Cocaine NONE DETECTED NONE DETECTED   Benzodiazepines NONE DETECTED NONE DETECTED   Amphetamines NONE DETECTED NONE DETECTED   Tetrahydrocannabinol NONE DETECTED NONE DETECTED   Barbiturates NONE DETECTED NONE DETECTED  CBC   Collection Time: 06/15/17  2:44 AM  Result Value Ref Range   WBC 10.9 (H) 4.0  - 10.5 K/uL   RBC 3.69 (L) 3.87 - 5.11 MIL/uL   Hemoglobin 11.3 (L) 12.0 - 15.0 g/dL   HCT 78.2 (L) 95.6 - 21.3 %   MCV 91.6 78.0 - 100.0 fL   MCH 30.6 26.0 - 34.0 pg   MCHC 33.4 30.0 - 36.0 g/dL   RDW 08.6 57.8 - 46.9 %   Platelets 223 150 - 400 K/uL  Comprehensive metabolic panel   Collection Time: 06/15/17  2:44 AM  Result Value Ref Range   Sodium 136 135 - 145 mmol/L   Potassium 4.1 3.5 - 5.1 mmol/L   Chloride 106 101 - 111 mmol/L   CO2 19 (L) 22 - 32 mmol/L   Glucose, Bld 96 65 - 99 mg/dL   BUN 15 6 - 20 mg/dL   Creatinine, Ser 6.29 0.44 - 1.00 mg/dL   Calcium 9.1 8.9 - 52.8 mg/dL   Total Protein 6.1 (L) 6.5 - 8.1 g/dL   Albumin 2.6 (L) 3.5 - 5.0 g/dL   AST 22 15 - 41 U/L   ALT 16 14 - 54 U/L   Alkaline Phosphatase 99 38 - 126 U/L   Total Bilirubin 0.3 0.3 - 1.2 mg/dL   GFR calc non Af Amer >60 >60 mL/min   GFR calc Af Amer >60 >60 mL/min   Anion gap 11 5 - 15  Type and screen [facility]    Collection Time: 06/15/17  3:20 AM  Result Value Ref Range  ABO/RH(D) A POS    Antibody Screen NEG    Sample Expiration      06/18/2017 Performed at [facility], [address], [place], [place] [address]   Magnesium   Collection Time: 06/15/17  6:32 PM  Result Value Ref Range   Magnesium 5.7 (H) 1.7 - 2.4 mg/dL  Comprehensive metabolic panel   Collection Time: 06/15/17  6:32 PM  Result Value Ref Range   Sodium 133 (L) 135 - 145 mmol/L   Potassium 3.8 3.5 - 5.1 mmol/L   Chloride 102 101 - 111 mmol/L   CO2 20 (L) 22 - 32 mmol/L   Glucose, Bld 105 (H) 65 - 99 mg/dL   BUN 11 6 - 20 mg/dL   Creatinine, Ser 1.91 0.44 - 1.00 mg/dL   Calcium 7.8 (L) 8.9 - 10.3 mg/dL   Total Protein 5.6 (L) 6.5 - 8.1 g/dL   Albumin 2.4 (L) 3.5 - 5.0 g/dL   AST 40 15 - 41 U/L   ALT 27 14 - 54 U/L   Alkaline Phosphatase 97 38 - 126 U/L   Total Bilirubin 0.3 0.3 - 1.2 mg/dL   GFR calc non Af Amer >60 >60 mL/min   GFR calc Af Amer >60 >60 mL/min   Anion gap 11 5 - 15   CBC   Collection Time: 06/15/17  6:32 PM  Result Value Ref Range   WBC 14.3 (H) 4.0 - 10.5 K/uL   RBC 3.74 (L) 3.87 - 5.11 MIL/uL   Hemoglobin 11.6 (L) 12.0 - 15.0 g/dL   HCT 47.8 (L) 29.5 - 62.1 %   MCV 91.7 78.0 - 100.0 fL   MCH 31.0 26.0 - 34.0 pg   MCHC 33.8 30.0 - 36.0 g/dL   RDW 30.8 65.7 - 84.6 %   Platelets 228 150 - 400 K/uL    Imaging Studies:    BBP 5/24 8/8   Medications:  Scheduled . betamethasone acetate-betamethasone sodium phosphate  12 mg Intramuscular Q24 Hr x 2  . docusate sodium  100 mg Oral Daily  . labetalol  300 mg Oral BID  . prenatal multivitamin  1 tablet Oral Q1200   I have reviewed the patient's current medications.  ASSESSMENT: Patient Active Problem List   Diagnosis Date Noted  . Preeclampsia, severe, third trimester 06/15/2017  . Pre-eclampsia 06/12/2017  . Bradycardia 05/25/2017  . Vaginal bleeding in pregnancy 05/19/2017  . ?PPROM on 4/29 05/19/2017  . Malpresentation before onset of labor 05/19/2017  . Previous cesarean section 05/01/2017  . UTI (urinary tract infection) during pregnancy, first trimester 05/01/2017  . Supervision of high-risk pregnancy 02/05/2017  . Smoker 02/05/2017  . Anxiety 01/20/2017  . History of preterm delivery, currently pregnant 01/08/2017  . Hx of preeclampsia, prior pregnancy, currently pregnant 01/08/2017    PLAN: Keep Magnesium until 24 hour urine complete.  Magnesium now at 1gram/hour Labetalol  bid   [place] with dopplers in am   Continue routine antenatal care. Repeat course of BMZ   Casten Floren Harraway-Smith 06/15/2017,8:56 PM

## 2017-06-15 NOTE — Progress Notes (Signed)
Patient ID: Kimberly Foster, female   DOB: 1990-03-15, 27 y.o.   MRN: [id number] ACULTY PRACTICE ANTEPARTUM COMPREHENSIVE PROGRESS NOTE  Kimberly Foster is a 27 y.o. G2P0101 at [redacted]w[redacted]d  who is admitted for [facility].   Fetal presentation is unsure. Length of Stay:  0  Days  Subjective: Pt without complaints this morning except magnesium effec. Denies HA, visual changes or epigastric pain. + FM. Denies vaginal bleeding, ut ctx or LOF. Tolerating diet.  Vitals:  Blood pressure 117/69, pulse 81, temperature 98 F (36.7 C), temperature source Oral, resp. rate 17, height  (1.549 m), weight 65.8 kg (145 lb), last menstrual period 11/18/2016, SpO2 99 %.   Physical Examination: Lungs clear Heart RRR Abd soft + BS gravid  Ext non tender  Fetal Monitoring:  Baseline: 130's bpm  Labs:  Results for orders placed or performed during the hospital encounter of 06/15/17 (from the past 24 hour(s))  Protein / creatinine ratio, urine   Collection Time: 06/15/17  2:30 AM  Result Value Ref Range   Creatinine, Urine 35.00 mg/dL   Total Protein, Urine 119 mg/dL   Protein Creatinine Ratio 3.40 (H) 0.00 - 0.15 mg/mg[Cre]  CBC   Collection Time: 06/15/17  2:44 AM  Result Value Ref Range   WBC 10.9 (H) 4.0 - 10.5 K/uL   RBC 3.69 (L) 3.87 - 5.11 MIL/uL   Hemoglobin 11.3 (L) 12.0 - 15.0 g/dL   HCT 40.9 (L) 81.1 - 91.4 %   MCV 91.6 78.0 - 100.0 fL   MCH 30.6 26.0 - 34.0 pg   MCHC 33.4 30.0 - 36.0 g/dL   RDW 78.2 95.6 - 21.3 %   Platelets 223 150 - 400 K/uL  Comprehensive metabolic panel   Collection Time: 06/15/17  2:44 AM  Result Value Ref Range   Sodium 136 135 - 145 mmol/L   Potassium 4.1 3.5 - 5.1 mmol/L   Chloride 106 101 - 111 mmol/L   CO2 19 (L) 22 - 32 mmol/L   Glucose, Bld 96 65 - 99 mg/dL   BUN 15 6 - 20 mg/dL   Creatinine, Ser 0.86 0.44 - 1.00 mg/dL   Calcium 9.1 8.9 - 57.8 mg/dL   Total Protein 6.1 (L) 6.5 - 8.1 g/dL   Albumin 2.6 (L) 3.5 - 5.0 g/dL   AST 22 15 - 41 U/L   ALT 16 14  - 54 U/L   Alkaline Phosphatase 99 38 - 126 U/L   Total Bilirubin 0.3 0.3 - 1.2 mg/dL   GFR calc non Af Amer >60 >60 mL/min   GFR calc Af Amer >60 >60 mL/min   Anion gap 11 5 - 15  Type and screen [facility] [place]   Collection Time: 06/15/17  3:20 AM  Result Value Ref Range   ABO/RH(D) A POS    Antibody Screen NEG    Sample Expiration      06/18/2017 Performed at [facility], [address], [place], [place] [address]     Imaging Studies:    BPP 8/8 on 06/12/17   Medications:  Scheduled . docusate sodium  100 mg Oral Daily  . prenatal multivitamin  1 tablet Oral Q1200   I have reviewed the patient's current medications.  ASSESSMENT: IUP 29 6/7 weeks SPEC Prior c section at 31 weeks d/t PEC Desires BTL H/O chronic abruption  PLAN: BP stable and improved on Magnesium. Will continue x 24 hrs. Monitor BP. S/P BMZ for FLM on 4/29 & 4/30. No evidence  of HELLP. Start 24 hr urine. Continue with current management    Hermina Staggers 06/15/2017,7:56 AM

## 2017-06-15 NOTE — MAU Note (Signed)
Was sitting on porch with FOB and realized had not checked b/p. Took 3 times and was elevated. Only discomfort is between shoulder blades and mild SOB. Denies visual changes, any abd pain,

## 2017-06-15 NOTE — Progress Notes (Signed)
Voiding now. This urine will be discarded and 24 hour urine for protein and creatinine clearance started.

## 2017-06-15 NOTE — H&P (Addendum)
Kimberly Foster is a 27 y.o. female presenting for elevation in BP readings at home  Was recently discharged from the hospital on 06/04/17 following observation for third trimester bleeding, thought to be due to chronic abruption.  She had also been evaluated for possible PPROM which was ruled out by dye test. She did receive Betamethasone on 4/29-30/19   She was reevaluated for preeclampsia with severe features on 06/12/17.  BPs 140's/90-100, pr cr ratio 1.89, other labs normal.  She was discharged home with followup on 06/14/17 for BP check.  She was started on Labetalol  bid. Her last dose was taken at 1700hrs (10 hrs ago).  She checked her BP at home and measured it in severe range 194/104  Her history is remarkable for preeclampsia with NRFHR necessitating Cesarean Delivery at 31 weeks.  This was a LTCS with vertical extension.  So repeat CS is planned with BTL Fetus was cephalic on 06/12/17 with Low Normal Amniotic fluid and anterior placenta  OB History    Gravida  2   Para  1   Term  0   Preterm  1   AB      Living  1     SAB      TAB      Ectopic      Multiple      Live Births  1          Past Medical History:  Diagnosis Date  . Anxiety   . Migraines   . Pre-eclampsia    Past Surgical History:  Procedure Laterality Date  . CESAREAN SECTION     Family History: family history includes Arrhythmia in her maternal uncle; COPD in her paternal grandfather; Cancer in her paternal uncle; Diabetes in her paternal aunt; Diverticulitis in her father; Heart attack in her maternal grandfather; Heart failure in her maternal grandfather; Hypertension in her father, maternal grandfather, maternal grandmother, mother, and paternal grandfather; Leukemia in her paternal grandmother; Mesothelioma in her paternal grandfather. Social History:  reports that she quit smoking about 3 weeks ago. Her smoking use included cigarettes. She smoked 0.50 packs per day. She has never used  smokeless tobacco. She reports that she does not drink alcohol or use drugs.     Maternal Diabetes: No Genetic Screening: Normal Maternal Ultrasounds/Referrals: Normal Fetal Ultrasounds or other Referrals:  None Maternal Substance Abuse:  No Significant Maternal Medications:  Meds include: Other:  Significant Maternal Lab Results:  Lab values include: Other:  Other Comments:  Severe preeclampsia, MgSO4 infusion  Review of Systems  Constitutional: Negative for chills, fever and malaise/fatigue.  Eyes: Negative for blurred vision.  Respiratory: Negative for shortness of breath.   Cardiovascular: Negative for chest pain and leg swelling.  Gastrointestinal: Negative for abdominal pain, constipation, diarrhea, nausea and vomiting.  Neurological: Negative for dizziness, sensory change, focal weakness and headaches.   Maternal Medical History:  Reason for admission: Nausea. Severe hypertension   Contractions: Frequency: irregular.   Perceived severity is mild.    Fetal activity: Perceived fetal activity is normal.   Last perceived fetal movement was within the past hour.    Prenatal complications: PIH, placental abnormality (chronic abruption) and pre-eclampsia.   No bleeding, infection or oligohydramnios (but low-normal).   Prenatal Complications - Diabetes: none.      Blood pressure (!) 190/90, pulse (!) 53, temperature 97.7 F (36.5 C), resp. rate 18, height  (1.549 m), weight 145 lb (65.8 kg), last menstrual period 11/18/2016, SpO2 100 %.  Maternal Exam:  Uterine Assessment: Contraction strength is mild.  Contraction frequency is irregular.   Abdomen: Patient reports no abdominal tenderness. Surgical scars: low transverse.   Fetal presentation: vertex  Introitus: Normal vulva. Normal vagina.    Fetal Exam Fetal Monitor Review: Mode: ultrasound.   Baseline rate: 140.  Variability: moderate (6-25 bpm).   Pattern: accelerations present and variable decelerations.     Fetal State Assessment: Category I - tracings are normal.    Had one variable decel lasting 30-40 seconds  Vitals:   06/15/17 0400 06/15/17 0402 06/15/17 0404 06/15/17 0420  BP: (!) 182/93  (!) 162/90 128/80  Pulse: (!) 51  (!) 51 82  Resp:  17  17  Temp:      TempSrc:      SpO2: 99%  99% 98%  Weight:      Height:        Physical Exam  Constitutional: She is oriented to person, place, and time. She appears well-developed and well-nourished. No distress.  HENT:  Head: Normocephalic.  Neck: Normal range of motion.  Cardiovascular: Normal rate, regular rhythm and normal heart sounds.  Respiratory: Effort normal. No respiratory distress.  GI: Soft. She exhibits no distension. There is no tenderness. There is no rebound and no guarding.  Musculoskeletal: Normal range of motion. She exhibits no edema.  Neurological: She is alert and oriented to person, place, and time. She displays abnormal reflex (3+). She exhibits normal muscle tone (No clonus).  Skin: Skin is warm and dry.  Psychiatric: She has a normal mood and affect.    Prenatal labs: ABO, Rh: --/--/A POS (05/15 0602) Antibody: NEG (05/15 0602) Rubella: 1.35 (01/16 1208) RPR: Non Reactive (05/09 0539)  HBsAg: Negative (01/16 1208)  HIV: Non Reactive (05/09 0539)  GBS:     Assessment/Plan: Single IUP at [redacted]w[redacted]d Preeclampsia with severe features History of Chronic Abruption  Admit to Antenatal Routine orders Magnesium Sulfate Has already had steroids Initiated preeclampsia focused orders protocol.  Will start with Nifedipine and max it out.  She required two doses which finally resulted in improved BP    Kimberly Foster 06/15/2017, 3:30 AM   OB Attending Agree with management and plan as outlined above. Nettie Elm, MD

## 2017-06-16 ENCOUNTER — Inpatient Hospital Stay (HOSPITAL_COMMUNITY): Payer: BLUE CROSS/BLUE SHIELD

## 2017-06-16 LAB — CREATININE CLEARANCE, URINE, 24 HOUR
CREAT CLEAR: 75 mL/min (ref 75–115)
CREATININE 24H UR: 636 mg/d (ref 600–1800)
Collection Interval-CRCL: 24 hours
Creatinine, Urine: 31 mg/dL
URINE TOTAL VOLUME-CRCL: 2050 mL

## 2017-06-16 LAB — COMPREHENSIVE METABOLIC PANEL
ALBUMIN: 2.3 g/dL — AB (ref 3.5–5.0)
ALT: 26 U/L (ref 14–54)
ALT: 22 U/L (ref 14–54)
AST: 34 U/L (ref 15–41)
AST: 28 U/L (ref 15–41)
Albumin: 2.4 g/dL — ABNORMAL LOW (ref 3.5–5.0)
Alkaline Phosphatase: 105 U/L (ref 38–126)
Alkaline Phosphatase: 97 U/L (ref 38–126)
Anion gap: 11 (ref 5–15)
Anion gap: 12 (ref 5–15)
BILIRUBIN TOTAL: 0.3 mg/dL (ref 0.3–1.2)
BILIRUBIN TOTAL: 0.2 mg/dL — AB (ref 0.3–1.2)
BUN: 13 mg/dL (ref 6–20)
BUN: 15 mg/dL (ref 6–20)
CALCIUM: 7.8 mg/dL — AB (ref 8.9–10.3)
CALCIUM: 8.4 mg/dL — AB (ref 8.9–10.3)
CO2: 20 mmol/L — ABNORMAL LOW (ref 22–32)
CO2: 18 mmol/L — AB (ref 22–32)
CREATININE: 0.59 mg/dL (ref 0.44–1.00)
Chloride: 103 mmol/L (ref 101–111)
Chloride: 106 mmol/L (ref 101–111)
Creatinine, Ser: 0.63 mg/dL (ref 0.44–1.00)
GFR calc Af Amer: >60 mL/min (ref >60)
GFR calc non Af Amer: >60 mL/min (ref >60)
GLUCOSE: 111 mg/dL — AB (ref 65–99)
Glucose, Bld: 125 mg/dL — ABNORMAL HIGH (ref 65–99)
POTASSIUM: 4.6 mmol/L (ref 3.5–5.1)
POTASSIUM: 4 mmol/L (ref 3.5–5.1)
Sodium: 134 mmol/L — ABNORMAL LOW (ref 135–145)
Sodium: 136 mmol/L (ref 135–145)
Total Protein: 6.3 g/dL — ABNORMAL LOW (ref 6.5–8.1)
Total Protein: 6.1 g/dL — ABNORMAL LOW (ref 6.5–8.1)

## 2017-06-16 LAB — CBC
HCT: 34 % — ABNORMAL LOW (ref 36.0–46.0)
HCT: 33.2 % — ABNORMAL LOW (ref 36.0–46.0)
Hemoglobin: 11.4 g/dL — ABNORMAL LOW (ref 12.0–15.0)
Hemoglobin: 11 g/dL — ABNORMAL LOW (ref 12.0–15.0)
MCH: 30.9 pg (ref 26.0–34.0)
MCH: 30.9 pg (ref 26.0–34.0)
MCHC: 33.5 g/dL (ref 30.0–36.0)
MCHC: 33.1 g/dL (ref 30.0–36.0)
MCV: 92.1 fL (ref 78.0–100.0)
MCV: 93.3 fL (ref 78.0–100.0)
PLATELETS: 219 10*3/uL (ref 150–400)
Platelets: 230 10*3/uL (ref 150–400)
RBC: 3.69 MIL/uL — ABNORMAL LOW (ref 3.87–5.11)
RBC: 3.56 MIL/uL — AB (ref 3.87–5.11)
RDW: 13.6 % (ref 11.5–15.5)
RDW: 13.7 % (ref 11.5–15.5)
WBC: 11.6 10*3/uL — AB (ref 4.0–10.5)
WBC: 17.3 10*3/uL — AB (ref 4.0–10.5)

## 2017-06-16 LAB — PROTEIN, URINE, 24 HOUR
Collection Interval-UPROT: 24 hours
PROTEIN, URINE: 50 mg/dL
Protein, 24H Urine: 1025 mg/d — ABNORMAL HIGH (ref 50–100)
URINE TOTAL VOLUME-UPROT: 2050 mL

## 2017-06-16 LAB — LACTATE DEHYDROGENASE: LDH: 189 U/L (ref 98–192)

## 2017-06-16 LAB — MAGNESIUM: Magnesium: 2.1 mg/dL (ref 1.7–2.4)

## 2017-06-16 MED ORDER — HYDRALAZINE HCL 20 MG/ML IJ SOLN
10.0000 mg | Freq: Once | INTRAMUSCULAR | Status: AC | PRN
  Administered 2017-06-16: 10 mg via INTRAVENOUS
  Filled 2017-06-16: qty 1

## 2017-06-16 MED ORDER — COMPLETENATE 29-1 MG PO CHEW
1.0000 | CHEWABLE_TABLET | Freq: Every day | ORAL | Status: DC
Start: 2017-06-17 — End: 2017-06-17
  Administered 2017-06-16: 1 via ORAL
  Filled 2017-06-16 (×2): qty 1

## 2017-06-16 MED ORDER — LACTATED RINGERS IV SOLN: INTRAVENOUS | Status: DC

## 2017-06-16 MED ORDER — LABETALOL HCL 5 MG/ML IV SOLN
20.0000 mg | INTRAVENOUS | Status: AC | PRN
  Administered 2017-06-16: 20 mg via INTRAVENOUS
  Administered 2017-06-16: 40 mg via INTRAVENOUS
  Administered 2017-06-16: 80 mg via INTRAVENOUS
  Filled 2017-06-16: qty 8
  Filled 2017-06-16: qty 16
  Filled 2017-06-16: qty 4

## 2017-06-16 NOTE — Progress Notes (Signed)
Patient ID: Teasha Murrillo, female   DOB: February 05, 1990, 27 y.o.   MRN: [id number] [facility] Attending  Pt without complaints. Denies HA, visual changes or epigastric pain. Denies vaginal bleeding, ut ctx or LOF + FM but decreased PE AF BP 110-150's/70-90's FHT's 140's, occ variables  BPP 6/10  A/P IUP 30 weeks        CHTN with super imposed severe PEC       Prior c section desire for repeat       Unwanted fertility, has private [employer] are stable. No evidence of HELLP. BP stable on Labetalol. Growth scan tomorrow. Repeat BPP tomorrow. Continuous monitor.

## 2017-06-16 NOTE — Progress Notes (Addendum)
Dr. Alysia Penna notified of patient's continued absent to minimal variability with variables on fetal heart tracing. No new orders at this time, RN to continue to monitor.

## 2017-06-16 NOTE — Progress Notes (Signed)
Patient ID: Litzi Binning, female   DOB: May 04, 1990, 27 y.o.   MRN: [id number] [facility] ANTEPART[facility] COMPREHENSIVE PROGRESS NOTE  Kimberly Foster is a 27 y.o. G2P0101 at [redacted]w[redacted]d  who is admitted for preeclampsia with severe features.   Fetal presentation is cephalic. Length of Stay:  1  Days  Subjective: Pt denies HA or visual changes.  Patient reports good fetal movement.  She reports no uterine contractions, no bleeding and no loss of fluid per vagina.  Vitals:  Blood pressure 128/86, pulse 62, temperature 98.2 F (36.8 C), temperature source Oral, resp. rate 16, height  (1.549 m), weight 145 lb (65.8 kg), last menstrual period 11/18/2016, SpO2 98 %. Physical Examination: General appearance - alert, well appearing, and in no distress Chest - clear to auscultation, no wheezes, rales or rhonchi, symmetric air entry Heart - normal rate, regular rhythm, normal S1, S2, no murmurs, rubs, clicks or gallops Extremities - peripheral pulses normal, no pedal edema, no clubbing or cyanosis Cervical Exam: Not evaluated.  Membranes:intact  Fetal Monitoring:  Baseline: 130's bpm, Variability: Absent, Accelerations: pt on magnesium. was prev reassuring.   and Decelerations: Absent  Labs:  Results for orders placed or performed during the hospital encounter of 06/15/17 (from the past 24 hour(s))  Magnesium   Collection Time: 06/15/17  6:32 PM  Result Value Ref Range   Magnesium 5.7 (H) 1.7 - 2.4 mg/dL  Comprehensive metabolic panel   Collection Time: 06/15/17  6:32 PM  Result Value Ref Range   Sodium 133 (L) 135 - 145 mmol/L   Potassium 3.8 3.5 - 5.1 mmol/L   Chloride 102 101 - 111 mmol/L   CO2 20 (L) 22 - 32 mmol/L   Glucose, Bld 105 (H) 65 - 99 mg/dL   BUN 11 6 - 20 mg/dL   Creatinine, Ser 4.09 0.44 - 1.00 mg/dL   Calcium 7.8 (L) 8.9 - 10.3 mg/dL   Total Protein 5.6 (L) 6.5 - 8.1 g/dL   Albumin 2.4 (L) 3.5 - 5.0 g/dL   AST 40 15 - 41 U/L   ALT 27 14 - 54 U/L   Alkaline  Phosphatase 97 38 - 126 U/L   Total Bilirubin 0.3 0.3 - 1.2 mg/dL   GFR calc non Af Amer >60 >60 mL/min   GFR calc Af Amer >60 >60 mL/min   Anion gap 11 5 - 15  CBC   Collection Time: 06/15/17  6:32 PM  Result Value Ref Range   WBC 14.3 (H) 4.0 - 10.5 K/uL   RBC 3.74 (L) 3.87 - 5.11 MIL/uL   Hemoglobin 11.6 (L) 12.0 - 15.0 g/dL   HCT 81.1 (L) 91.4 - 78.2 %   MCV 91.7 78.0 - 100.0 fL   MCH 31.0 26.0 - 34.0 pg   MCHC 33.8 30.0 - 36.0 g/dL   RDW 95.6 21.3 - 08.6 %   Platelets 228 150 - 400 K/uL  Comprehensive metabolic panel   Collection Time: 06/16/17  5:12 AM  Result Value Ref Range   Sodium 134 (L) 135 - 145 mmol/L   Potassium 4.6 3.5 - 5.1 mmol/L   Chloride 103 101 - 111 mmol/L   CO2 20 (L) 22 - 32 mmol/L   Glucose, Bld 125 (H) 65 - 99 mg/dL   BUN 13 6 - 20 mg/dL   Creatinine, Ser 5.78 0.44 - 1.00 mg/dL   Calcium 7.8 (L) 8.9 - 10.3 mg/dL   Total Protein 6.3 (L) 6.5 - 8.1 g/dL   Albumin  2.4 (L) 3.5 - 5.0 g/dL   AST 34 15 - 41 U/L   ALT 26 14 - 54 U/L   Alkaline Phosphatase 105 38 - 126 U/L   Total Bilirubin 0.3 0.3 - 1.2 mg/dL   GFR calc non Af Amer >60 >60 mL/min   GFR calc Af Amer >60 >60 mL/min   Anion gap 11 5 - 15  CBC   Collection Time: 06/16/17  5:12 AM  Result Value Ref Range   WBC 11.6 (H) 4.0 - 10.5 K/uL   RBC 3.69 (L) 3.87 - 5.11 MIL/uL   Hemoglobin 11.4 (L) 12.0 - 15.0 g/dL   HCT 16.1 (L) 09.6 - 04.5 %   MCV 92.1 78.0 - 100.0 fL   MCH 30.9 26.0 - 34.0 pg   MCHC 33.5 30.0 - 36.0 g/dL   RDW 40.9 81.1 - 91.4 %   Platelets 219 150 - 400 K/uL    Imaging Studies:    none  Medications:  Scheduled . betamethasone acetate-betamethasone sodium phosphate  12 mg Intramuscular Q24 Hr x 2  . docusate sodium  100 mg Oral Daily  . labetalol  300 mg Oral BID  . prenatal multivitamin  1 tablet Oral Q1200   I have reviewed the patient's current medications.  ASSESSMENT: Patient Active Problem List   Diagnosis Date Noted  . Preeclampsia, severe, third  trimester 06/15/2017  . Pre-eclampsia 06/12/2017  . Bradycardia 05/25/2017  . Vaginal bleeding in pregnancy 05/19/2017  . ?PPROM on 4/29 05/19/2017  . Malpresentation before onset of labor 05/19/2017  . Previous cesarean section 05/01/2017  . UTI (urinary tract infection) during pregnancy, first trimester 05/01/2017  . Supervision of high-risk pregnancy 02/05/2017  . Smoker 02/05/2017  . Anxiety 01/20/2017  . History of preterm delivery, currently pregnant 01/08/2017  . Hx of preeclampsia, prior pregnancy, currently pregnant 01/08/2017    PLAN: Magnesium off this am F/u 24 hours urine for protein Fetal US for growth and dopplers Give 2nd repeat dose of BMZ today Deliver for maternal or fetal indications   Continue routine antenatal care.  Kimberly Foster 06/16/2017,7:53 AM

## 2017-06-16 NOTE — Progress Notes (Signed)
No FHR response to drinking cold liquid or auditory stimulation of music played near maternal abdomen.

## 2017-06-17 ENCOUNTER — Encounter (HOSPITAL_COMMUNITY): Payer: Self-pay | Admitting: Obstetrics and Gynecology

## 2017-06-17 ENCOUNTER — Inpatient Hospital Stay (HOSPITAL_COMMUNITY): Payer: BLUE CROSS/BLUE SHIELD | Admitting: Anesthesiology

## 2017-06-17 ENCOUNTER — Encounter (HOSPITAL_COMMUNITY)
Admission: AD | Disposition: A | Discharge status: Home / Self Care | Payer: Self-pay | Source: Home / Self Care | Attending: Obstetrics and Gynecology

## 2017-06-17 ENCOUNTER — Encounter: Payer: BLUE CROSS/BLUE SHIELD | Admitting: Women's Health

## 2017-06-17 ENCOUNTER — Inpatient Hospital Stay (HOSPITAL_COMMUNITY): Payer: BLUE CROSS/BLUE SHIELD

## 2017-06-17 DIAGNOSIS — Z3A30 30 weeks gestation of pregnancy: Secondary | ICD-10-CM

## 2017-06-17 DIAGNOSIS — O365930 Maternal care for other known or suspected poor fetal growth, third trimester, not applicable or unspecified: Secondary | ICD-10-CM

## 2017-06-17 DIAGNOSIS — O1414 Severe pre-eclampsia complicating childbirth: Secondary | ICD-10-CM

## 2017-06-17 DIAGNOSIS — O34211 Maternal care for low transverse scar from previous cesarean delivery: Secondary | ICD-10-CM

## 2017-06-17 DIAGNOSIS — Z302 Encounter for sterilization: Secondary | ICD-10-CM

## 2017-06-17 DIAGNOSIS — O4103X0 Oligohydramnios, third trimester, not applicable or unspecified: Secondary | ICD-10-CM

## 2017-06-17 HISTORY — PX: TUBAL LIGATION: SHX77

## 2017-06-17 LAB — CBC
HCT: 35.5 % — ABNORMAL LOW (ref 36.0–46.0)
HCT: 32.2 % — ABNORMAL LOW (ref 36.0–46.0)
HEMOGLOBIN: 10.7 g/dL — AB (ref 12.0–15.0)
Hemoglobin: 11.9 g/dL — ABNORMAL LOW (ref 12.0–15.0)
MCH: 31.4 pg (ref 26.0–34.0)
MCH: 31.4 pg (ref 26.0–34.0)
MCHC: 33.5 g/dL (ref 30.0–36.0)
MCHC: 33.2 g/dL (ref 30.0–36.0)
MCV: 93.7 fL (ref 78.0–100.0)
MCV: 94.4 fL (ref 78.0–100.0)
PLATELETS: 233 10*3/uL (ref 150–400)
Platelets: 241 10*3/uL (ref 150–400)
RBC: 3.79 MIL/uL — ABNORMAL LOW (ref 3.87–5.11)
RBC: 3.41 MIL/uL — ABNORMAL LOW (ref 3.87–5.11)
RDW: 14 % (ref 11.5–15.5)
RDW: 13.9 % (ref 11.5–15.5)
WBC: 23.6 10*3/uL — ABNORMAL HIGH (ref 4.0–10.5)
WBC: 28.4 10*3/uL — ABNORMAL HIGH (ref 4.0–10.5)

## 2017-06-17 LAB — COMPREHENSIVE METABOLIC PANEL
ALK PHOS: 98 U/L (ref 38–126)
ALK PHOS: 92 U/L (ref 38–126)
ALT: 22 U/L (ref 14–54)
ALT: 21 U/L (ref 14–54)
ANION GAP: 10 (ref 5–15)
ANION GAP: 12 (ref 5–15)
AST: 25 U/L (ref 15–41)
AST: 32 U/L (ref 15–41)
Albumin: 2.5 g/dL — ABNORMAL LOW (ref 3.5–5.0)
Albumin: 2.4 g/dL — ABNORMAL LOW (ref 3.5–5.0)
BUN: 15 mg/dL (ref 6–20)
BUN: 14 mg/dL (ref 6–20)
CALCIUM: 9.1 mg/dL (ref 8.9–10.3)
CALCIUM: 7.9 mg/dL — AB (ref 8.9–10.3)
CHLORIDE: 108 mmol/L (ref 101–111)
CO2: 18 mmol/L — ABNORMAL LOW (ref 22–32)
CO2: 16 mmol/L — ABNORMAL LOW (ref 22–32)
CREATININE: 0.51 mg/dL (ref 0.44–1.00)
Chloride: 105 mmol/L (ref 101–111)
Creatinine, Ser: 0.63 mg/dL (ref 0.44–1.00)
GFR calc Af Amer: >60 mL/min (ref >60)
GFR calc non Af Amer: >60 mL/min (ref >60)
Glucose, Bld: 98 mg/dL (ref 65–99)
Glucose, Bld: 165 mg/dL — ABNORMAL HIGH (ref 65–99)
Potassium: 4.4 mmol/L (ref 3.5–5.1)
Potassium: 3.8 mmol/L (ref 3.5–5.1)
SODIUM: 136 mmol/L (ref 135–145)
SODIUM: 133 mmol/L — AB (ref 135–145)
TOTAL PROTEIN: 6 g/dL — AB (ref 6.5–8.1)
Total Bilirubin: 0.2 mg/dL — ABNORMAL LOW (ref 0.3–1.2)
Total Bilirubin: 0.1 mg/dL — ABNORMAL LOW (ref 0.3–1.2)
Total Protein: 6.4 g/dL — ABNORMAL LOW (ref 6.5–8.1)

## 2017-06-17 SURGERY — Surgical Case
Anesthesia: Spinal | Site: Abdomen | Wound class: Clean Contaminated

## 2017-06-17 MED ORDER — OXYCODONE HCL 5 MG PO TABS
10.0000 mg | ORAL_TABLET | ORAL | Status: DC | PRN
  Administered 2017-06-18 – 2017-06-20 (×8): 10 mg via ORAL
  Filled 2017-06-17 (×8): qty 2

## 2017-06-17 MED ORDER — LACTATED RINGERS IV SOLN
INTRAVENOUS | Status: DC
  Administered 2017-06-18: 75 mL/h via INTRAVENOUS
  Administered 2017-06-18: 13:00:00 via INTRAVENOUS

## 2017-06-17 MED ORDER — PRENATAL MULTIVITAMIN CH
1.0000 | ORAL_TABLET | Freq: Every day | ORAL | Status: DC
  Filled 2017-06-17: qty 1

## 2017-06-17 MED ORDER — MAGNESIUM SULFATE BOLUS VIA INFUSION
4.0000 g | Freq: Once | INTRAVENOUS | Status: AC
  Administered 2017-06-17: 4 g via INTRAVENOUS
  Filled 2017-06-17: qty 500

## 2017-06-17 MED ORDER — DEXAMETHASONE SODIUM PHOSPHATE 4 MG/ML IJ SOLN
INTRAMUSCULAR | Status: DC | PRN
  Administered 2017-06-17: 4 mg via INTRAVENOUS

## 2017-06-17 MED ORDER — FAMOTIDINE 20 MG PO TABS: 20.0000 mg | ORAL_TABLET | Freq: Two times a day (BID) | ORAL | Status: DC | PRN

## 2017-06-17 MED ORDER — PHENYLEPHRINE 8 MG IN D5W 100 ML (0.08MG/ML) PREMIX OPTIME
INJECTION | INTRAVENOUS | Status: AC
  Filled 2017-06-17: qty 100

## 2017-06-17 MED ORDER — PHENYLEPHRINE 8 MG IN D5W 100 ML (0.08MG/ML) PREMIX OPTIME
INJECTION | INTRAVENOUS | Status: DC | PRN
  Administered 2017-06-17: 40 ug/min via INTRAVENOUS

## 2017-06-17 MED ORDER — FENTANYL CITRATE (PF) 100 MCG/2ML IJ SOLN
INTRAMUSCULAR | Status: AC
  Filled 2017-06-17: qty 2

## 2017-06-17 MED ORDER — OXYCODONE HCL 5 MG PO TABS: 5.0000 mg | ORAL_TABLET | Freq: Once | ORAL | Status: DC | PRN

## 2017-06-17 MED ORDER — DIPHENHYDRAMINE HCL 25 MG PO CAPS: 25.0000 mg | ORAL_CAPSULE | ORAL | Status: DC | PRN

## 2017-06-17 MED ORDER — FENTANYL CITRATE (PF) 100 MCG/2ML IJ SOLN
INTRAMUSCULAR | Status: DC | PRN
  Administered 2017-06-17: 10 ug via INTRAVENOUS

## 2017-06-17 MED ORDER — HYDROMORPHONE HCL 1 MG/ML IJ SOLN
0.2500 mg | INTRAMUSCULAR | Status: DC | PRN
  Administered 2017-06-17: 0.5 mg via INTRAVENOUS

## 2017-06-17 MED ORDER — DOCUSATE SODIUM 100 MG PO CAPS: 100.0000 mg | ORAL_CAPSULE | Freq: Every day | ORAL | Status: DC | PRN

## 2017-06-17 MED ORDER — FAMOTIDINE IN NACL 20-0.9 MG/50ML-% IV SOLN
20.0000 mg | Freq: Once | INTRAVENOUS | Status: AC
  Administered 2017-06-17: 20 mg via INTRAVENOUS
  Filled 2017-06-17: qty 50

## 2017-06-17 MED ORDER — COCONUT OIL OIL
1.0000 "application " | TOPICAL_OIL | Status: DC | PRN
  Administered 2017-06-18: 1 via TOPICAL
  Filled 2017-06-17: qty 120

## 2017-06-17 MED ORDER — ONDANSETRON HCL 4 MG/2ML IJ SOLN: 4.0000 mg | Freq: Three times a day (TID) | INTRAMUSCULAR | Status: DC | PRN

## 2017-06-17 MED ORDER — IBUPROFEN 600 MG PO TABS
600.0000 mg | ORAL_TABLET | Freq: Four times a day (QID) | ORAL | Status: DC | PRN
  Filled 2017-06-17: qty 1

## 2017-06-17 MED ORDER — CEFAZOLIN SODIUM-DEXTROSE 2-4 GM/100ML-% IV SOLN
2.0000 g | INTRAVENOUS | Status: AC
  Administered 2017-06-17: 2 g via INTRAVENOUS
  Filled 2017-06-17: qty 100

## 2017-06-17 MED ORDER — DIPHENHYDRAMINE HCL 25 MG PO CAPS: 25.0000 mg | ORAL_CAPSULE | Freq: Four times a day (QID) | ORAL | Status: DC | PRN

## 2017-06-17 MED ORDER — TETANUS-DIPHTH-ACELL PERTUSSIS 5-2.5-18.5 LF-MCG/0.5 IM SUSP
0.5000 mL | Freq: Once | INTRAMUSCULAR | Status: AC
  Administered 2017-06-18: 0.5 mL via INTRAMUSCULAR
  Filled 2017-06-17: qty 0.5

## 2017-06-17 MED ORDER — DIBUCAINE 1 % RE OINT: 1.0000 "application " | TOPICAL_OINTMENT | RECTAL | Status: DC | PRN

## 2017-06-17 MED ORDER — DIPHENHYDRAMINE HCL 50 MG/ML IJ SOLN: 12.5000 mg | INTRAMUSCULAR | Status: DC | PRN

## 2017-06-17 MED ORDER — SODIUM CHLORIDE 0.9 % IR SOLN
Status: DC | PRN
  Administered 2017-06-17: 1000 mL

## 2017-06-17 MED ORDER — PHENYLEPHRINE HCL 10 MG/ML IJ SOLN
INTRAMUSCULAR | Status: DC | PRN
  Administered 2017-06-17: 80 ug via INTRAVENOUS

## 2017-06-17 MED ORDER — MENTHOL 3 MG MT LOZG: 1.0000 | LOZENGE | OROMUCOSAL | Status: DC | PRN

## 2017-06-17 MED ORDER — SIMETHICONE 80 MG PO CHEW
80.0000 mg | CHEWABLE_TABLET | Freq: Three times a day (TID) | ORAL | Status: DC
  Administered 2017-06-17 – 2017-06-20 (×8): 80 mg via ORAL
  Filled 2017-06-17 (×8): qty 1

## 2017-06-17 MED ORDER — LACTATED RINGERS IV SOLN
INTRAVENOUS | Status: DC | PRN
  Administered 2017-06-17: 16:00:00 via INTRAVENOUS

## 2017-06-17 MED ORDER — ACETAMINOPHEN 325 MG PO TABS
650.0000 mg | ORAL_TABLET | ORAL | Status: DC | PRN
  Administered 2017-06-17: 650 mg via ORAL
  Filled 2017-06-17: qty 2

## 2017-06-17 MED ORDER — LABETALOL HCL 5 MG/ML IV SOLN: 20.0000 mg | INTRAVENOUS | Status: DC | PRN

## 2017-06-17 MED ORDER — OXYTOCIN 40 UNITS IN LACTATED RINGERS INFUSION - SIMPLE MED: 2.5000 [IU]/h | INTRAVENOUS | Status: DC

## 2017-06-17 MED ORDER — SCOPOLAMINE 1 MG/3DAYS TD PT72: 1.0000 | MEDICATED_PATCH | Freq: Once | TRANSDERMAL | Status: DC

## 2017-06-17 MED ORDER — ENOXAPARIN SODIUM 40 MG/0.4ML ~~LOC~~ SOLN
40.0000 mg | SUBCUTANEOUS | Status: DC
  Administered 2017-06-19: 40 mg via SUBCUTANEOUS
  Filled 2017-06-17 (×3): qty 0.4

## 2017-06-17 MED ORDER — OXYCODONE HCL 5 MG/5ML PO SOLN: 5.0000 mg | Freq: Once | ORAL | Status: DC | PRN

## 2017-06-17 MED ORDER — SENNOSIDES-DOCUSATE SODIUM 8.6-50 MG PO TABS
2.0000 | ORAL_TABLET | Freq: Every evening | ORAL | Status: DC | PRN
Start: 2017-06-17 — End: 2017-06-20
  Administered 2017-06-18: 2 via ORAL
  Filled 2017-06-17: qty 2

## 2017-06-17 MED ORDER — WITCH HAZEL-GLYCERIN EX PADS: 1.0000 "application " | MEDICATED_PAD | CUTANEOUS | Status: DC | PRN

## 2017-06-17 MED ORDER — OXYCODONE HCL 5 MG PO TABS
5.0000 mg | ORAL_TABLET | ORAL | Status: DC | PRN
  Administered 2017-06-20: 5 mg via ORAL
  Filled 2017-06-17: qty 1

## 2017-06-17 MED ORDER — NALBUPHINE HCL 10 MG/ML IJ SOLN: 5.0000 mg | Freq: Once | INTRAMUSCULAR | Status: DC | PRN

## 2017-06-17 MED ORDER — NALBUPHINE HCL 10 MG/ML IJ SOLN: 5.0000 mg | INTRAMUSCULAR | Status: DC | PRN

## 2017-06-17 MED ORDER — NALOXONE HCL 0.4 MG/ML IJ SOLN: 0.4000 mg | INTRAMUSCULAR | Status: DC | PRN

## 2017-06-17 MED ORDER — SODIUM CHLORIDE 0.9% FLUSH: 3.0000 mL | INTRAVENOUS | Status: DC | PRN

## 2017-06-17 MED ORDER — HYDROMORPHONE HCL 1 MG/ML IJ SOLN
INTRAMUSCULAR | Status: AC
  Filled 2017-06-17: qty 0.5

## 2017-06-17 MED ORDER — LACTATED RINGERS IV SOLN
INTRAVENOUS | Status: DC | PRN
  Administered 2017-06-17: 40 [IU] via INTRAVENOUS

## 2017-06-17 MED ORDER — HYDRALAZINE HCL 20 MG/ML IJ SOLN
5.0000 mg | INTRAMUSCULAR | Status: DC | PRN
  Administered 2017-06-17: 5 mg via INTRAVENOUS
  Filled 2017-06-17: qty 1

## 2017-06-17 MED ORDER — SCOPOLAMINE 1 MG/3DAYS TD PT72
MEDICATED_PATCH | TRANSDERMAL | Status: DC | PRN
  Administered 2017-06-17: 1 via TRANSDERMAL

## 2017-06-17 MED ORDER — PROMETHAZINE HCL 25 MG/ML IJ SOLN: 6.2500 mg | INTRAMUSCULAR | Status: DC | PRN

## 2017-06-17 MED ORDER — MAGNESIUM SULFATE 40 G IN LACTATED RINGERS - SIMPLE
2.0000 g/h | INTRAVENOUS | Status: DC
  Filled 2017-06-17: qty 500

## 2017-06-17 MED ORDER — SOD CITRATE-CITRIC ACID 500-334 MG/5ML PO SOLN
30.0000 mL | ORAL | Status: AC
  Administered 2017-06-17: 30 mL via ORAL
  Filled 2017-06-17: qty 15

## 2017-06-17 MED ORDER — NIFEDIPINE ER OSMOTIC RELEASE 30 MG PO TB24
30.0000 mg | ORAL_TABLET | Freq: Every day | ORAL | Status: DC
  Administered 2017-06-17 – 2017-06-20 (×4): 30 mg via ORAL
  Filled 2017-06-17 (×4): qty 1

## 2017-06-17 MED ORDER — OXYTOCIN 10 UNIT/ML IJ SOLN
INTRAMUSCULAR | Status: AC
  Filled 2017-06-17: qty 4

## 2017-06-17 MED ORDER — NALOXONE HCL 4 MG/10ML IJ SOLN: 1.0000 ug/kg/h | INTRAVENOUS | Status: DC | PRN

## 2017-06-17 MED ORDER — SCOPOLAMINE 1 MG/3DAYS TD PT72
MEDICATED_PATCH | TRANSDERMAL | Status: AC
  Filled 2017-06-17: qty 2

## 2017-06-17 MED ORDER — MAGNESIUM SULFATE 40 G IN LACTATED RINGERS - SIMPLE
2.0000 g/h | INTRAVENOUS | Status: AC
  Administered 2017-06-18: 2 g/h via INTRAVENOUS
  Filled 2017-06-17: qty 40
  Filled 2017-06-17: qty 500

## 2017-06-17 MED ORDER — BUPIVACAINE IN DEXTROSE 0.75-8.25 % IT SOLN
INTRATHECAL | Status: DC | PRN
  Administered 2017-06-17: 1.4 mL via INTRATHECAL

## 2017-06-17 MED ORDER — MORPHINE SULFATE (PF) 0.5 MG/ML IJ SOLN
INTRAMUSCULAR | Status: AC
  Filled 2017-06-17: qty 10

## 2017-06-17 MED ORDER — MORPHINE SULFATE (PF) 0.5 MG/ML IJ SOLN
INTRAMUSCULAR | Status: DC | PRN
  Administered 2017-06-17: .2 mg via EPIDURAL

## 2017-06-17 MED ORDER — STERILE WATER FOR IRRIGATION IR SOLN
Status: DC | PRN
  Administered 2017-06-17: 1000 mL

## 2017-06-17 MED ORDER — ONDANSETRON HCL 4 MG/2ML IJ SOLN
INTRAMUSCULAR | Status: AC
  Filled 2017-06-17: qty 2

## 2017-06-17 MED ORDER — HYDRALAZINE HCL 20 MG/ML IJ SOLN: 5.0000 mg | INTRAMUSCULAR | Status: DC | PRN

## 2017-06-17 SURGICAL SUPPLY — 34 items
BENZOIN TINCTURE PRP APPL 2/3 (GAUZE/BANDAGES/DRESSINGS) ×3 IMPLANT
CANISTER SUCT 3000ML PPV (MISCELLANEOUS) ×3 IMPLANT
CHLORAPREP W/TINT 26ML (MISCELLANEOUS) ×3 IMPLANT
DRSG OPSITE POSTOP 4X10 (GAUZE/BANDAGES/DRESSINGS) ×3 IMPLANT
ELECT REM PT RETURN 9FT ADLT (ELECTROSURGICAL) ×3
ELECTRODE REM PT RTRN 9FT ADLT (ELECTROSURGICAL) ×2 IMPLANT
EXTRACTOR VACUUM KIWI (MISCELLANEOUS) ×3 IMPLANT
GLOVE BIOGEL PI IND STRL 7.0 (GLOVE) ×4 IMPLANT
GLOVE BIOGEL PI IND STRL 7.5 (GLOVE) ×2 IMPLANT
GLOVE BIOGEL PI INDICATOR 7.0 (GLOVE) ×2
GLOVE BIOGEL PI INDICATOR 7.5 (GLOVE) ×1
GLOVE SKINSENSE NS SZ7.0 (GLOVE) ×1
GLOVE SKINSENSE STRL SZ7.0 (GLOVE) ×2 IMPLANT
GOWN STRL REUS W/ TWL LRG LVL3 (GOWN DISPOSABLE) ×4 IMPLANT
GOWN STRL REUS W/ TWL XL LVL3 (GOWN DISPOSABLE) ×2 IMPLANT
GOWN STRL REUS W/TWL LRG LVL3 (GOWN DISPOSABLE) ×2
GOWN STRL REUS W/TWL XL LVL3 (GOWN DISPOSABLE) ×1
NS IRRIG 1000ML POUR BTL (IV SOLUTION) ×3 IMPLANT
PACK C SECTION WH (CUSTOM PROCEDURE TRAY) ×3 IMPLANT
PAD ABD 7.5X8 STRL (GAUZE/BANDAGES/DRESSINGS) ×3 IMPLANT
PAD OB MATERNITY 4.3X12.25 (PERSONAL CARE ITEMS) ×3 IMPLANT
PAD PREP 24X48 CUFFED NSTRL (MISCELLANEOUS) ×3 IMPLANT
PENCIL SMOKE EVAC W/HOLSTER (ELECTROSURGICAL) ×3 IMPLANT
STRIP CLOSURE SKIN 1/2X4 (GAUZE/BANDAGES/DRESSINGS) ×3 IMPLANT
SUT MNCRL 0 VIOLET CTX 36 (SUTURE) ×4 IMPLANT
SUT MON AB 4-0 PS1 27 (SUTURE) ×3 IMPLANT
SUT MONOCRYL 0 CTX 36 (SUTURE) ×2
SUT PLAIN 2 0 XLH (SUTURE) ×3 IMPLANT
SUT VIC AB 0 CT1 36 (SUTURE) ×6 IMPLANT
SUT VIC AB 3-0 CT1 27 (SUTURE) ×1
SUT VIC AB 3-0 CT1 TAPERPNT 27 (SUTURE) ×2 IMPLANT
SUT VICRYL 2 0 18  TIES (SUTURE) ×1
SUT VICRYL 2 0 18 TIES (SUTURE) ×2 IMPLANT
TOWEL OR 17X24 6PK STRL BLUE (TOWEL DISPOSABLE) ×6 IMPLANT

## 2017-06-17 NOTE — Anesthesia Procedure Notes (Addendum)
Spinal  Patient location during procedure: OR Start time: 06/17/2017 3:38 PM End time: 06/17/2017 3:41 PM Staffing Anesthesiologist: Leonides Grills, MD Performed: other anesthesia staff  Preanesthetic Checklist Completed: patient identified, site marked, surgical consent, pre-op evaluation, timeout performed, IV checked, risks and benefits discussed and monitors and equipment checked Spinal Block Patient position: sitting Prep: DuraPrep Patient monitoring: heart rate, continuous pulse ox and blood pressure Approach: midline Location: L3-4 Injection technique: single-shot Needle Needle type: Pencan  Needle gauge: 24 G Needle length: 10 cm Assessment Sensory level: T6

## 2017-06-17 NOTE — Op Note (Signed)
Operative Note   SURGERY DATE: 06/17/2017  PRE-OP DIAGNOSIS:  *Pregnancy at 30/1 *Pre-eclampsia with severe features (BP, protein) *Newly diagnosed fetal growth restriction with oligohydramnios *History of prior cesarean section and desire for repeat *Desire for permanent sterilization  POST-OP DIAGNOSIS: Same. Delivered   PROCEDURE: Repeat low transverse cesarean section via pfannenstiel skin incision with double layer uterine closure and bilateral tubal ligation via modified parkland  SURGEON: Surgeon(s) and Role:    * Shawne Eskelson, Billey Gosling, MD - Primary  ASSISTANT: None  ANESTHESIA: spinal  ESTIMATED BLOOD LOSS:  DRAINS: UOP via indwelling foley  TOTAL IV FLUIDS: crystalloid  VTE PROPHYLAXIS: SCDs to bilateral lower extremities  ANTIBIOTICS: Two grams of Cefazolin were given., within 1 hour of skin incision  SPECIMENS: placenta to pathology and arterial/venous cord gases  COMPLICATIONS: None  FINDINGS: No intra-abdominal adhesions were noted. Grossly normal uterus, tubes and ovaries. Moderately stained meconium amniotic fluid, cephalic female infant, weight 900gm, APGARs 6/9, intact placenta.  Results for Kimberly, Foster (MRN [id number]) as of 06/17/2017 16:55  Ref. Range 06/17/2017 16:06 06/17/2017 16:06  pH cord blood (arterial) Latest Ref Range: 7.210 - 7.380   7.233  pCO2 cord blood (arterial) Latest Ref Range: 42.0 - 56.0 mmHg  57.4 (H)  Bicarbonate Latest Ref Range: 13.0 - 22.0 mmol/L 21.5 23.4 (H)  Ph Cord Blood (Venous) Latest Ref Range: 7.240 - 7.380  7.296   pCO2 Cord Blood (Venous) Latest Ref Range: 42.0 - 56.0  45.6     PROCEDURE IN DETAIL: The patient was taken to the operating room where anesthesia was administered and normal fetal heart tones were confirmed. She was then prepped and draped in the normal fashion in the dorsal supine position with a leftward tilt.  After a time out was performed, a pfannensteil  skin incision was made with  the scalpel and carried through to the underlying layer of fascia. The fascia was then incised at the midline and this incision was extended laterally with the mayo scissors. Attention was turned to the superior aspect of the fascial incision which was grasped with the kocher clamps x 2, tented up and the rectus muscles were dissected off with the scalpel. In a similar fashion the inferior aspect of the fascial incision was grasped with the kocher clamps, tented up and the rectus muscles dissected off with the mayo scissors. The rectus muscles were then separated in the midline and the peritoneum was entered bluntly. The bladder blade was inserted and the vesicouterine peritoneum was identified, tented up and entered with the metzenbaum scissors. This incision was extended laterally and the bladder flap was created digitally. The bladder blade was reinserted.  A low transverse hysterotomy was made with the scalpel until the endometrial cavity was breached and the amniotic sac ruptured with the Allis clamp, yielding moderate meconium stained amniotic fluid. This incision was extended bluntly and the infant's head, shoulders and body were delivered atraumatically.The cord was clamped x 2 and cut, and the infant was handed to the awaiting pediatricians, after delayed cord clamping was done for approximately 45 seconds.  The placenta was then gradually expressed from the uterus and then the uterus was exteriorized and cleared of all clots and debris. The hysterotomy was repaired with a running suture of 1-0 monocry. A second imbricating layer of 1-0 monocryl suture was then placed to achieve excellent hemostasis.   The left Fallopian tube was identified by tracing out to the fimbraie, grasped with the Babcock clamps. An avascular midsection  of the left tube approximately 3-4cm from the cornua was grasped with the babcock clamps and the distal and proximal aspects were ligated with a suture of 2-0 vicryl, with the  intervening portion of tube was transected and removed, via the Metzenbaum scissors.  Attention was then turned to the right fallopian tube after confirmation by tracing the tube out to the fimbriae, with excellent hemostasis was noted from both BTL sites.   The uterus and adnexa were then returned to the abdomen, and the hysterotomy and all operative sites were reinspected and excellent hemostasis was noted after irrigation and suction of the abdomen with warm saline and BTL sites intact and hemostatic.   The peritoneum was closed with a running stitch of 3-0 Vicryl. The fascia was reapproximated with 0 Vicryl in a simple running fashion bilaterally. The subcutaneous layer was then reapproximated with interrupted sutures of 2-0 plain gut, and the skin was then closed with 4-0 monocryl, in a subcuticular fashion.  The patient  tolerated the procedure well. Sponge, lap, needle, and instrument counts were correct x 2. The patient was transferred to the recovery room awake, alert and breathing independently in stable condition.  Cornelia Copa MD Attending Center for [facility])

## 2017-06-17 NOTE — Progress Notes (Signed)
Daily Antepartum Note  Admission Date: 06/15/2017 Current Date: 06/17/2017 9:24 AM  Kimberly Foster is a 27 y.o. G2P0101 @ [redacted]w[redacted]d, HD#3, admitted for severe pre-eclampsia.  Pregnancy complicated by: Patient Active Problem List   Diagnosis Date Noted  . Preeclampsia, severe, third trimester 06/15/2017  . Pre-eclampsia 06/12/2017  . Bradycardia 05/25/2017  . Vaginal bleeding in pregnancy 05/19/2017  . ?PPROM on 4/29 05/19/2017  . Malpresentation before onset of labor 05/19/2017  . Previous cesarean section 05/01/2017  . UTI (urinary tract infection) during pregnancy, first trimester 05/01/2017  . Supervision of high-risk pregnancy 02/05/2017  . Smoker 02/05/2017  . Anxiety 01/20/2017  . History of preterm delivery, currently pregnant 01/08/2017  . Hx of preeclampsia, prior pregnancy, currently pregnant 01/08/2017    Overnight/24hr events:  Elevated BPs and got IV meds, on continuous EFM for category II tracing and 6/10 bpp (-2 movement on prelim)  Subjective:  No s/s of pre-eclampsia  Objective:    Current Vital Signs 24h Vital Sign Ranges  T 98.8 F (37.1 C) Temp  Avg: 98.5 F (36.9 C)  Min: 98.3 F (36.8 C)  Max: 98.8 F (37.1 C)  BP 132/77(repeat bp) BP  Min: 132/77  Max: 193/95  HR 67 Pulse  Avg: 64.5  Min: 56  Max: 75  RR 18 Resp  Avg: 18.2  Min: 18  Max: 19  SaO2 98 % Room Air SpO2  Avg: 98.3 %  Min: 98 %  Max: 99 %       24 Hour I/O Current Shift I/O  Time Ins Outs 05/27 0701 - 05/28 0700 In: 960 [P.O.:960] Out: 2300 [Urine:2300] No intake/output data recorded.   Patient Vitals for the past 24 hrs:  BP Temp Temp src Pulse Resp SpO2  06/17/17 0726 - 98.8 F (37.1 C) Oral 67 18 -  06/17/17 0620 132/77 - - 62 - -  06/17/17 0545 (!) 172/91 - - (!) 56 - -  06/17/17 0530 - 98.8 F (37.1 C) Oral 65 - 98 %  06/16/17 2311 (!) 149/87 - - 65 - -  06/16/17 2254 (!) 186/98 - - 63 - -  06/16/17 2241 (!) 193/95 - - 62 - -  06/16/17 2226 (!) 184/89 - - (!) 57 - -   06/16/17 2206 (!) 187/94 - - 66 - -  06/16/17 2108 (!) 168/92 - - - - -  06/16/17 2030 (!) 174/91 98.3 F (36.8 C) Oral 75 18 98 %  06/16/17 1930 - - - - 19 -  06/16/17 1531 (!) 151/93 98.3 F (36.8 C) Oral 70 18 98 %  06/16/17 1130 (!) 143/91 98.4 F (36.9 C) Oral 66 18 99 %   FHT: 145 baseline, no accels, an episodic decel just now, min variability Toco: quiet Physical exam: General: Well nourished, well developed female in no acute distress. Abdomen: gravid, nttp Cardiovascular: S1, S2 normal, no murmur, rub or gallop, regular rate and rhythm Respiratory: CTAB Extremities: no clubbing, cyanosis or edema Skin: Warm and dry.   Medications: Current Facility-Administered Medications  Medication Dose Route Frequency Provider Last Rate Last Dose  . acetaminophen (TYLENOL) tablet 650 mg  650 mg Oral Q4H PRN Aviva Signs, CNM   650 mg at 06/17/17 4098  . calcium carbonate (TUMS - dosed in mg elemental calcium) chewable tablet 400 mg of elemental calcium  2 tablet Oral Q4H PRN Aviva Signs, CNM   400 mg of elemental calcium at 06/17/17 0601  . docusate sodium (COLACE) capsule 100  mg  100 mg Oral Daily PRN [place] Bing, MD      . hydrALAZINE (APRESOLINE) injection 5-10 mg  5-10 mg Intravenous Q20 Min PRN Hermina Staggers, MD   5 mg at 06/17/17 0559  . labetalol (NORMODYNE) tablet 300 mg  300 mg Oral BID Willodean Rosenthal, MD   300 mg at 06/17/17 0859  . labetalol (NORMODYNE,TRANDATE) injection 20-40 mg  20-40 mg Intravenous Q10 min PRN Hermina Staggers, MD      . labetalol (NORMODYNE,TRANDATE) injection 40 mg  40 mg Intravenous Once PRN Aviva Signs, CNM      . lactated ringers infusion   Intravenous Continuous Aviva Signs, CNM 50 mL/hr at 06/16/17 1930    . NIFEdipine (PROCARDIA) capsule 10-20 mg  10-20 mg Oral Q20 Min PRN Aviva Signs, CNM   20 mg at 06/15/17 0406  . NIFEdipine (PROCARDIA-XL/ADALAT-CC/NIFEDICAL-XL) 24 hr tablet 30 mg  30 mg Oral Daily  [place] Bing, MD   30 mg at 06/17/17 0859  . ondansetron (ZOFRAN) injection 4 mg  4 mg Intravenous Q8H PRN Aviva Signs, CNM   4 mg at 06/15/17 1627  . prenatal vitamin w/FE, FA (NATACHEW) chewable tablet 1 tablet  1 tablet Oral Q1200 Hermina Staggers, MD   1 tablet at 06/16/17 2031  . promethazine (PHENERGAN) injection 25 mg  25 mg Intravenous Q8H PRN Willodean Rosenthal, MD   25 mg at 06/15/17 2004  . zolpidem (AMBIEN) tablet 5 mg  5 mg Oral QHS PRN Aviva Signs, CNM        Labs:  Recent Labs  Lab 06/15/17 1832 06/16/17 0512 06/16/17 1711  WBC 14.3* 11.6* 17.3*  HGB 11.6* 11.4* 11.0*  HCT 34.3* 34.0* 33.2*  PLT 228 219 230    Recent Labs  Lab 06/15/17 1832 06/16/17 0512 06/16/17 1711  NA 133* 134* 136  K 3.8 4.6 4.0  CL 102 103 106  CO2 20* 20* 18*  BUN CREATININE 0.51 0.59 0.63  CALCIUM 7.8* 7.8* 8.4*  PROT 5.6* 6.3* 6.1*  BILITOT 0.3 0.3 0.2*  ALKPHOS 97 105 97  ALT AST 40 34 28  GLUCOSE 105* 125* 111*     Radiology: growth u/s final report not up but looks like FGR 6%, 962gm, AC <1%, asymmetric, AFI 4.8 and no 2x2 pocket, UA dopplers looks elevated  Assessment & Plan:  Pt stable *Pregnancy: desires BTL and has private insurance. D/w her re: this namely permanency and she desires this *Severe pre-x: repeat cbc and cmp. Added procardia 30 xl to regimen for this morning's meds. S/p Mg on admission. Will call mfm but told pt to be npo still (hasn't eaten anything today), t&s UTD *h/o c/s: desires rpt and btl.  *Preterm: s/p rescue bmz on 5/26 and 5/27. D/w nicu once final delivery plan is in place *PPx: SCDs *FEN/GI: NPO, MIVF  Cornelia Copa MD Attending Center for [facility])

## 2017-06-17 NOTE — Consult Note (Signed)
Neonatology consultation  Asked to update patient (previously seen by Dr. Algernon Huxley on 5/6). Now at 30.[redacted] wks EGA and recent onset preeclampsia/severe features; plans for C/section later today.  I spoke with patient about overall improvement in prognosis since she has now reached 30+ wks and has had 2 courses of BMZ, but still have similar concerns about possible complications.  Discussed probable LOS to near due date, advantages of breast milk feedings, possible use of donor milk.  Patient's previous child (now 18 yo) was born at 101 wks due to preeclampsia at [facility] and did well per her report, so she has NICU experience.  Thank you for consulting Neonatology.  Total time 25 minutes, 15 minutes face-to-face.  Shelaine Frie E. Barrie Dunker., MD Neonatologist

## 2017-06-17 NOTE — Anesthesia Preprocedure Evaluation (Addendum)
Anesthesia Evaluation  Patient identified by MRN, date of birth, ID band Patient awake    Reviewed: Allergy & Precautions, NPO status , Patient's Chart, lab work & pertinent test results  Airway Mallampati: II  TM Distance: >3 FB Neck ROM: Full    Dental no notable dental hx.    Pulmonary former smoker,    Pulmonary exam normal breath sounds clear to auscultation       Cardiovascular hypertension, Pt. on medications Normal cardiovascular exam Rhythm:Regular Rate:Normal  ECG: SB, rate 59   Neuro/Psych  Headaches, Anxiety    GI/Hepatic negative GI ROS, Neg liver ROS,   Endo/Other  negative endocrine ROS  Renal/GU negative Renal ROS     Musculoskeletal negative musculoskeletal ROS (+)   Abdominal   Peds  Hematology  (+) anemia ,   Anesthesia Other Findings Pre-eclampsia  Reproductive/Obstetrics (+) Pregnancy                            Anesthesia Physical Anesthesia Plan  ASA: II  Anesthesia Plan: Spinal   Post-op Pain Management:    Induction:   PONV Risk Score and Plan: 2 and Ondansetron, Treatment may vary due to age or medical condition and Scopolamine patch - Pre-op  Airway Management Planned: Natural Airway  Additional Equipment:   Intra-op Plan:   Post-operative Plan:   Informed Consent: I have reviewed the patients History and Physical, chart, labs and discussed the procedure including the risks, benefits and alternatives for the proposed anesthesia with the patient or authorized representative who has indicated his/her understanding and acceptance.   Dental advisory given  Plan Discussed with: CRNA  Anesthesia Plan Comments:        Anesthesia Quick Evaluation

## 2017-06-17 NOTE — Transfer of Care (Signed)
Immediate Anesthesia Transfer of Care Note  Patient: Kimberly Foster  Procedure(s) Performed: CESAREAN SECTION (N/A Abdomen) BILATERAL TUBAL LIGATION (Bilateral Abdomen)  Patient Location: PACU  Anesthesia Type:Spinal  Level of Consciousness: awake  Airway & Oxygen Therapy: Patient Spontanous Breathing  Post-op Assessment: Report given to RN  Post vital signs: Reviewed and stable  Last Vitals:  Vitals Value Taken Time  BP    Temp    Pulse    Resp    SpO2      Last Pain:  Vitals:   06/17/17 1504  TempSrc: Oral  PainSc:       Patients Stated Pain Goal: 3 (06/16/17 1930)  Complications: No apparent anesthesia complications

## 2017-06-17 NOTE — Progress Notes (Addendum)
AP Note  D/w MFM and u/s shows what I said on previous note and they recommend delivery  D/w pt and no s/s of pre-eclampsia  Patient Vitals for the past 24 hrs:  BP Temp Temp src Pulse Resp SpO2  06/17/17 0726 - 98.8 F (37.1 C) Oral 67 18 -  06/17/17 0620 132/77 - - 62 - -  06/17/17 0545 (!) 172/91 - - (!) 56 - -  06/17/17 0530 - 98.8 F (37.1 C) Oral 65 - 98 %  06/16/17 2311 (!) 149/87 - - 65 - -  06/16/17 2254 (!) 186/98 - - 63 - -  06/16/17 2241 (!) 193/95 - - 62 - -  06/16/17 2226 (!) 184/89 - - (!) 57 - -  06/16/17 2206 (!) 187/94 - - 66 - -  06/16/17 2108 (!) 168/92 - - - - -  06/16/17 2030 (!) 174/91 98.3 F (36.8 C) Oral 75 18 98 %  06/16/17 1930 - - - - 19 -  06/16/17 1531 (!) 151/93 98.3 F (36.8 C) Oral 70 18 98 %  06/16/17 1130 (!) 143/91 98.4 F (36.9 C) Oral 66 18 99 %    CBC Latest Ref Rng & Units 06/17/2017 06/16/2017 06/16/2017  WBC 4.0 - 10.5 K/uL 23.6(H) 17.3(H) 11.6(H)  Hemoglobin 12.0 - 15.0 g/dL 11.9(L) 11.0(L) 11.4(L)  Hematocrit 36.0 - 46.0 % 35.5(L) 33.2(L) 34.0(L)  Platelets 150 - 400 K/uL 233 230 219   CMP pending  NAD  A/P: pt stable MFM Dr Sherrie George recommends delivery. D/w pt and amenable to proceeding delivery today; FOB approx 1 hour away so reasonable to wait until early afternoon as long as M-F status is stable. . Pt desires rpt c/s and BTL.   F/u cmp. Continue NPO. Start Mg and MIVF. D/w NICU and they will try and come down and see.   Cornelia Copa MD Attending Center for [employer] (Faculty Practice) 06/17/2017 Time: 1015am

## 2017-06-18 ENCOUNTER — Telehealth: Payer: Self-pay | Admitting: *Deleted

## 2017-06-18 ENCOUNTER — Encounter (HOSPITAL_COMMUNITY): Payer: Self-pay | Admitting: *Deleted

## 2017-06-18 LAB — CBC
HCT: 30.1 % — ABNORMAL LOW (ref 36.0–46.0)
Hemoglobin: 9.9 g/dL — ABNORMAL LOW (ref 12.0–15.0)
MCH: 30.7 pg (ref 26.0–34.0)
MCHC: 32.9 g/dL (ref 30.0–36.0)
MCV: 93.5 fL (ref 78.0–100.0)
PLATELETS: 220 10*3/uL (ref 150–400)
RBC: 3.22 MIL/uL — ABNORMAL LOW (ref 3.87–5.11)
RDW: 13.6 % (ref 11.5–15.5)
WBC: 21.8 10*3/uL — AB (ref 4.0–10.5)

## 2017-06-18 LAB — COMPREHENSIVE METABOLIC PANEL
ALBUMIN: 2.2 g/dL — AB (ref 3.5–5.0)
ALK PHOS: 83 U/L (ref 38–126)
ALT: 16 U/L (ref 14–54)
AST: 23 U/L (ref 15–41)
Anion gap: 10 (ref 5–15)
BILIRUBIN TOTAL: 0.3 mg/dL (ref 0.3–1.2)
BUN: 13 mg/dL (ref 6–20)
CO2: 20 mmol/L — ABNORMAL LOW (ref 22–32)
CREATININE: 0.54 mg/dL (ref 0.44–1.00)
Calcium: 7.6 mg/dL — ABNORMAL LOW (ref 8.9–10.3)
Chloride: 103 mmol/L (ref 101–111)
GFR calc Af Amer: >60 mL/min (ref >60)
GLUCOSE: 125 mg/dL — AB (ref 65–99)
POTASSIUM: 3.8 mmol/L (ref 3.5–5.1)
Sodium: 133 mmol/L — ABNORMAL LOW (ref 135–145)
TOTAL PROTEIN: 5.6 g/dL — AB (ref 6.5–8.1)

## 2017-06-18 LAB — RPR: RPR Ser Ql: NONREACTIVE

## 2017-06-18 MED ORDER — IBUPROFEN 100 MG/5ML PO SUSP
600.0000 mg | Freq: Four times a day (QID) | ORAL | Status: DC
  Administered 2017-06-18 – 2017-06-20 (×8): 600 mg via ORAL
  Administered 2017-06-20: 20 mg via ORAL
  Administered 2017-06-20: 600 mg via ORAL
  Filled 2017-06-18 (×13): qty 30

## 2017-06-18 MED ORDER — COMPLETENATE 29-1 MG PO CHEW
1.0000 | CHEWABLE_TABLET | Freq: Every day | ORAL | Status: DC
Start: 2017-06-18 — End: 2017-06-20
  Administered 2017-06-18 – 2017-06-20 (×3): 1 via ORAL
  Filled 2017-06-18 (×4): qty 1

## 2017-06-18 NOTE — Anesthesia Postprocedure Evaluation (Signed)
Anesthesia Post Note  Patient: Market researcher  Procedure(s) Performed: CESAREAN SECTION (N/A Abdomen) BILATERAL TUBAL LIGATION (Bilateral Abdomen)     Patient location during evaluation: [facility] Anesthesia Type: Spinal Level of consciousness: awake and alert and oriented Pain management: pain level controlled Vital Signs Assessment: post-procedure vital signs reviewed and stable Respiratory status: spontaneous breathing, nonlabored ventilation and respiratory function stable Cardiovascular status: stable Postop Assessment: no backache, no headache, patient able to bend at knees, no apparent nausea or vomiting and adequate PO intake Anesthetic complications: no    Last Vitals:  Vitals:   06/18/17 0600 06/18/17 0700  BP:    Pulse:    Resp: 15 16  Temp:    SpO2: 97% 98%    Last Pain:  Vitals:   06/18/17 0700  TempSrc:   PainSc: 0-No pain   Pain Goal: Patients Stated Pain Goal: 3 (06/18/17 0700)               Madison Hickman

## 2017-06-18 NOTE — Anesthesia Postprocedure Evaluation (Signed)
Anesthesia Post Note  Patient: Market researcher  Procedure(s) Performed: CESAREAN SECTION (N/A Abdomen) BILATERAL TUBAL LIGATION (Bilateral Abdomen)     Patient location during evaluation: PACU Anesthesia Type: Spinal Level of consciousness: oriented and awake and alert Pain management: pain level controlled Vital Signs Assessment: post-procedure vital signs reviewed and stable Respiratory status: spontaneous breathing, respiratory function stable and patient connected to nasal cannula oxygen Cardiovascular status: blood pressure returned to baseline and stable Postop Assessment: no headache, no backache and no apparent nausea or vomiting Anesthetic complications: no    Last Vitals:  Vitals:   06/18/17 0500 06/18/17 0600  BP:    Pulse:    Resp: 16 15  Temp:    SpO2: 98% 97%    Last Pain:  Vitals:   06/18/17 0600  TempSrc:   PainSc: 0-No pain                 Mee Macdonnell P Jahron Hunsinger

## 2017-06-18 NOTE — Progress Notes (Signed)
Daily Postpartum Note  Admission Date: 06/15/2017 Current Date: 06/18/2017 8:53 AM  Kimberly Foster is a 27 y.o. [id number] POD#1 s/p rLTCS and BTL @ 30/2, admitted for severe pre-eclampsia.  Pregnancy complicated by: Patient Active Problem List   Diagnosis Date Noted  . Preeclampsia, severe, third trimester 06/15/2017  . Bradycardia 05/25/2017  . Vaginal bleeding in pregnancy 05/19/2017  . ?PPROM on 4/29 05/19/2017  . Previous cesarean section 05/01/2017  . UTI (urinary tract infection) during pregnancy, first trimester 05/01/2017  . Supervision of high-risk pregnancy 02/05/2017  . Smoker 02/05/2017  . Anxiety 01/20/2017  . History of preterm delivery, currently pregnant 01/08/2017  . Hx of preeclampsia, prior pregnancy, currently pregnant 01/08/2017    Overnight/24hr events:  none  Subjective:  Doing well, +flatus. No s/s of pre-eclampsia  Objective:    Current Vital Signs 24h Vital Sign Ranges  T 97.8 F (36.6 C) Temp  Avg: 97.7 F (36.5 C)  Min: 97.4 F (36.3 C)  Max: 98.3 F (36.8 C)  BP 135/81 BP  Min: 102/65  Max: 174/96  HR 62 Pulse  Avg: 67.1  Min: 58  Max: 79  RR 16 Resp  Avg: 16.7  Min: 12  Max: 19  SaO2 100 % Room Air SpO2  Avg: 99.1 %  Min: 97 %  Max: 100 %       24 Hour I/O Current Shift I/O  Time Ins Outs 05/28 0701 - 05/29 0700 In: 7025.6 [P.O.:3985; I.V.:2990.6] Out: 6084 [Urine:5815] 05/29 0701 - 05/29 1900 In: 160 [P.O.:60; I.V.:100] Out: 275 [Urine:275]   Patient Vitals for the past 24 hrs:  BP Temp Temp src Pulse Resp SpO2  06/18/17 0817 135/81 97.8 F (36.6 C) Oral 62 16 100 %  06/18/17 0700 - - - - 16 98 %  06/18/17 0600 - - - - 15 97 %  06/18/17 0500 - - - - 16 98 %  06/18/17 0400 - - - - - 100 %  06/18/17 0359 127/74 98 F (36.7 C) Oral 75 16 100 %  06/18/17 0300 114/71 - - 62 15 99 %  06/18/17 0200 108/71 - - 66 16 98 %  06/18/17 0100 117/80 - - (!) 59 15 99 %  06/18/17 0001 - - - - - 99 %  06/17/17 2358 111/68 97.6 F (36.4 C)  Oral 70 16 -  06/17/17 2319 113/75 - - 63 16 99 %  06/17/17 2220 - - - - - 99 %  06/17/17 2215 - - - - - 99 %  06/17/17 2214 - - - - - 99 %  06/17/17 2210 109/70 - - 69 17 100 %  06/17/17 2204 115/67 - - 75 18 100 %  06/17/17 2200 - - - - 18 -  06/17/17 2100 117/69 - - 65 18 99 %  06/17/17 2007 114/73 97.6 F (36.4 C) Oral 68 18 98 %  06/17/17 1906 117/87 (!) 97.5 F (36.4 C) Oral 61 18 100 %  06/17/17 1819 117/76 - - 64 18 100 %  06/17/17 1800 114/72 - - 63 17 100 %  06/17/17 1745 119/72 (!) 97.4 F (36.3 C) Axillary 72 18 100 %  06/17/17 1730 (!) 113/48 - - 69 19 99 %  06/17/17 1715 118/73 (!) 97.4 F (36.3 C) - 60 12 99 %  06/17/17 1703 102/65 (!) 97.4 F (36.3 C) - 66 17 98 %  06/17/17 1504 127/76 97.8 F (36.6 C) Oral 74 18 99 %  06/17/17 1406  124/81 98.3 F (36.8 C) Oral 71 18 100 %  06/17/17 1316 136/90 97.9 F (36.6 C) Oral 75 18 -  06/17/17 1213 - 98.1 F (36.7 C) Oral - - -  06/17/17 1203 136/88 - - 69 16 -  06/17/17 1052 137/84 - - 79 16 -  06/17/17 1051 - - - - - 99 %  06/17/17 1049 137/86 - - 78 17 -  06/17/17 1046 (!) 140/92 - - 69 - 99 %  06/17/17 1043 (!) 144/93 - - 68 17 -  06/17/17 1041 - - - - - 99 %  06/17/17 1040 (!) 156/90 - - (!) 59 - -  06/17/17 1038 (!) 168/94 - - (!) 58 16 -  06/17/17 1036 (!) 174/96 - - (!) 58 17 -   UOP: >17mL/hr  Physical exam: General: Well nourished, well developed female in no acute distress. Abdomen: firm fundus below umbilicus, c/d/i dressing Cardiovascular: S1, S2 normal, no murmur, rub or gallop, regular rate and rhythm Respiratory: CTAB Extremities: no clubbing, cyanosis or edema Skin: Warm and dry.  GU: normal UOP with approx in bag  Medications: Current Facility-Administered Medications  Medication Dose Route Frequency Provider Last Rate Last Dose  . acetaminophen (TYLENOL) tablet 650 mg  650 mg Oral Q4H PRN [place] Bing, MD   650 mg at 06/17/17 2101  . coconut oil  1 application Topical PRN  [place] Bing, MD      . witch hazel-glycerin (TUCKS) pad 1 application  1 application Topical PRN [place] Bing, MD       And  . dibucaine (NUPERCAINAL) 1 % rectal ointment 1 application  1 application Rectal PRN [place] Bing, MD      . diphenhydrAMINE (BENADRYL) capsule 25 mg  25 mg Oral Q6H PRN [place] Bing, MD      . diphenhydrAMINE (BENADRYL) injection 12.5 mg  12.5 mg Intravenous Q4H PRN Ellender, Catheryn Bacon, MD       Or  . diphenhydrAMINE (BENADRYL) capsule 25 mg  25 mg Oral Q4H PRN Ellender, Catheryn Bacon, MD      . enoxaparin (LOVENOX) injection 40 mg  40 mg Subcutaneous Q24H [place] Bing, MD      . famotidine (PEPCID) tablet 20 mg  20 mg Oral BID PRN [place] Bing, MD      . hydrALAZINE (APRESOLINE) injection 5-10 mg  5-10 mg Intravenous Q20 Min PRN [place] Bing, MD      . ibuprofen (ADVIL,MOTRIN) tablet 600 mg  600 mg Oral Q6H PRN [place] Bing, MD      . labetalol (NORMODYNE,TRANDATE) injection 20-40 mg  20-40 mg Intravenous Q10 min PRN [place] Bing, MD      . lactated ringers infusion   Intravenous Continuous [place] Bing, MD 75 mL/hr at 06/18/17 0008 75 mL/hr at 06/18/17 0008  . magnesium sulfate 40 grams in LR 500 mL OB infusion  2 g/hr Intravenous Continuous [place] Bing, MD 25 mL/hr at 06/18/17 0700 2 g/hr at 06/18/17 0700  . menthol-cetylpyridinium (CEPACOL) lozenge 3 mg  1 lozenge Oral Q2H PRN [place] Bing, MD      . nalbuphine (NUBAIN) injection 5 mg  5 mg Intravenous Q4H PRN Ellender, Catheryn Bacon, MD       Or  . nalbuphine (NUBAIN) injection 5 mg  5 mg Subcutaneous Q4H PRN Ellender, Catheryn Bacon, MD      . nalbuphine (NUBAIN) injection 5 mg  5 mg Intravenous Once PRN Ellender, Catheryn Bacon, MD       Or  .  nalbuphine (NUBAIN) injection 5 mg  5 mg Subcutaneous Once PRN Ellender, Catheryn Bacon, MD      . naloxone (NARCAN) injection 0.4 mg  0.4 mg Intravenous PRN Ellender, Catheryn Bacon, MD       And  . sodium chloride flush (NS) 0.9 % injection 3 mL  3 mL Intravenous PRN  Ellender, Catheryn Bacon, MD      . naloxone HCl (NARCAN) 2 mg in dextrose 5 % 250 mL infusion  1-4 mcg/kg/hr Intravenous Continuous PRN Ellender, Catheryn Bacon, MD      . NIFEdipine (PROCARDIA-XL/ADALAT-CC/NIFEDICAL-XL) 24 hr tablet 30 mg  30 mg Oral Daily [place] Bing, MD   30 mg at 06/17/17 0859  . ondansetron (ZOFRAN) injection 4 mg  4 mg Intravenous Q8H PRN Ellender, Catheryn Bacon, MD      . oxyCODONE (Oxy IR/ROXICODONE) immediate release tablet 10 mg  10 mg Oral Q4H PRN [place] Bing, MD      . oxyCODONE (Oxy IR/ROXICODONE) immediate release tablet 5 mg  5 mg Oral Q4H PRN [place] Bing, MD      . oxytocin (PITOCIN) IV infusion 40 units in LR 1000 mL - Premix  2.5 Units/hr Intravenous Continuous [place] Bing, MD   Stopped at 06/18/17 0008  . prenatal multivitamin tablet 1 tablet  1 tablet Oral Q1200 [place] Bing, MD      . scopolamine (TRANSDERM-SCOP) 1 MG/3DAYS 1.5 mg  1 patch Transdermal Once Ellender, Catheryn Bacon, MD      . senna-docusate (Senokot-S) tablet 2 tablet  2 tablet Oral QHS PRN [place] Bing, MD      . simethicone (MYLICON) chewable tablet 80 mg  80 mg Oral TID Ilean Skill, MD   80 mg at 06/18/17 0809  . Tdap (BOOSTRIX) injection 0.5 mL  0.5 mL Intramuscular Once  Bing, MD        Labs:  Recent Labs  Lab 06/17/17 [phone] 06/17/17 2050 06/18/17 0824  WBC 23.6* 28.4* 21.8*  HGB 11.9* 10.7* 9.9*  HCT 35.5* 32.2* 30.1*  PLT 233 241 220    Recent Labs  Lab 06/16/17 1711 06/17/17 0937 06/17/17 2050  NA 136 136 133*  K 4.0 4.4 3.8  CL 106 108 105  CO2 18* 18* 16*  BUN CREATININE 0.63 0.51 0.63  CALCIUM 8.4* 9.1 7.9*  PROT 6.1* 6.4* 6.0*  BILITOT 0.2* 0.2* 0.1*  ALKPHOS 97 98 92  ALT AST 28 25 32  GLUCOSE 111* 98 165*     Radiology: no new imaging  Assessment & Plan:  Pt doing well *Postpartum:routine care. Foley out later today. Pumping, A POS *Severe pre-x: f/u cmp this morning. Mg until approx 1630 today. Will keep  procardia xl and see how her BPs are today *PPx: lovenox, SCDs, OOB *FEN/GI: regular diet, SLIV after Mg is done *Dispo: likely pod#3  Cornelia Copa. MD Attending Center for [facility])

## 2017-06-18 NOTE — Lactation Note (Signed)
This note was copied from a baby's chart. Lactation Consultation Note  Patient Name: Kimberly Foster [id number] Date: 06/18/2017 Reason for consult: Initial assessment;Preterm <34wks;NICU baby;Other (Comment);Infant < 6lbs(per mom 1st abby was in NICU to )  Baby is 87 hours old  As LC and orientee entered the room, mom mentioned she had pumped with results ( 3 ml ).  LC and orientee reviewed hand expressing with several drops and mom returned demo.  LC discussed supply and demand and the importance of consistent pumping 8 - 10 x's in 24 hours.  Per mom using the #27 Flange and it is comfortable.  LC asked the RN to obtain coconut oil for mom to use prior to pumping to assist to moisten up dry nipples.  Yellow stickers given to mom to label containers . Per mom already had 2 that went to NICU.  Mother informed of post-discharge support and given phone number to the lactation department, including services for phone call assistance; out-patient appointments; and breastfeeding support group. List of other breastfeeding resources in the community given in the handout. Encouraged mother to call for problems or concerns related to breastfeeding.  Maternal Data Has patient been taught Hand Expression?: Yes(LC and orientee instructed mom and returned demo , and did well ) Does the patient have breastfeeding experience prior to this delivery?: Yes  Feeding    LATCH Score                   Interventions Interventions: Breast feeding basics reviewed  Lactation Tools Discussed/Used Tools: Pump Breast pump type: Double-Electric Breast Pump Pump Review: Milk Storage(aLREADY SET UP )   Consult Status Consult Status: Follow-up Date: 06/19/17 Follow-up type: In-patient    [name] 06/18/2017, 10:15 AM

## 2017-06-18 NOTE — Progress Notes (Signed)
I offered initial support to [facility].  She is coping as well as can be expected with her baby being in the NICU.  She stated that she has good support and that as long as her baby is doing well, she is doing well.  She had a friend with her in the room, so our visit was short, but she was agreeable to [place] coming to check on her later.  Chaplain Dyanne Carrel, Bcc PAger, [phone] 1:47 PM

## 2017-06-18 NOTE — Telephone Encounter (Signed)
Left message with man for pt to call us back to schedule pp appointment.  06-18-17  AS

## 2017-06-18 NOTE — Addendum Note (Signed)
Addendum  created 06/18/17 0756 by Shanon Payor, CRNA   Sign clinical note

## 2017-06-19 ENCOUNTER — Telehealth: Payer: Self-pay | Admitting: *Deleted

## 2017-06-19 NOTE — Progress Notes (Signed)
Subjective: Postpartum Day 2: Cesarean Delivery Patient reports no complaints, pain well controlled.    Objective: Vital signs in last 24 hours: Temp:  [97.5 F (36.4 C)-98.4 F (36.9 C)] 98.3 F (36.8 C) (05/30 0510) Pulse Rate:  [62-74] 64 (05/30 0510) Resp:  [16-20] 16 (05/30 0510) BP: (120-141)/(81-92) 120/82 (05/30 0510) SpO2:  [98 %-100 %] 98 % (05/30 0510) Vitals:   06/18/17 1800 06/18/17 2045 06/18/17 2313 06/19/17 0510  BP:  123/86 (!) 136/92 120/82  Pulse:  70 64 64  Resp: Temp:  98.2 F (36.8 C) 98.4 F (36.9 C) 98.3 F (36.8 C)  TempSrc:  Oral Oral Oral  SpO2:  100% 100% 98%  Weight:      Height:       Physical Exam:  General: alert, cooperative and no distress Lochia: appropriate Uterine Fundus: firm Incision: healing well, no significant drainage, no dehiscence, no significant erythema DVT Evaluation: No evidence of DVT seen on physical exam.  Recent Labs    06/17/17 2050 06/18/17 0824  HGB 10.7* 9.9*  HCT 32.2* 30.1*    Assessment/Plan: Status post Cesarean section. Doing well postoperatively.  Continue current care. Home tomorrow  Lazaro Arms 06/19/2017, 7:26 AM

## 2017-06-19 NOTE — Telephone Encounter (Signed)
Lmom for pt to call us back to set up pp appointment.  06-19-17  AS

## 2017-06-19 NOTE — Lactation Note (Signed)
This note was copied from a baby's chart. Lactation Consultation Note  Patient Name: Kimberly Foster Date: 06/19/2017  Mom states she is pumping every 3 hours and obtaining small amounts of colostrum.  She has a Medela pump in style for home use.  Instructed to bring symphony parts with her when visiting NICU and use pumping room.  Supplied with bottles.  Encouraged to call for assist prn.   Maternal Data    Feeding Feeding Type: Breast Milk  LATCH Score                   Interventions    Lactation Tools Discussed/Used     Consult Status      [name] 06/19/2017, 12:44 PM

## 2017-06-20 MED ORDER — FERROUS GLUCONATE 324 (38 FE) MG PO TABS
324.0000 mg | ORAL_TABLET | Freq: Two times a day (BID) | ORAL | Status: DC
  Administered 2017-06-20: 324 mg via ORAL
  Filled 2017-06-20 (×2): qty 1

## 2017-06-20 MED ORDER — POLYETHYLENE GLYCOL 3350 17 G PO PACK: 17.0000 g | PACK | Freq: Every day | ORAL | Status: DC

## 2017-06-20 MED ORDER — IBUPROFEN 100 MG/5ML PO SUSP: 600.0000 mg | Freq: Four times a day (QID) | ORAL | 0 refills | Status: DC | PRN

## 2017-06-20 MED ORDER — ACETAMINOPHEN 325 MG PO TABS: 650.0000 mg | ORAL_TABLET | Freq: Four times a day (QID) | ORAL | 0 refills | Status: AC | PRN

## 2017-06-20 MED ORDER — FERROUS GLUCONATE 324 (38 FE) MG PO TABS: 324.0000 mg | ORAL_TABLET | Freq: Every day | ORAL | 0 refills | Status: DC

## 2017-06-20 MED ORDER — POLYETHYLENE GLYCOL 3350 17 G PO PACK: 17.0000 g | PACK | Freq: Every day | ORAL | 1 refills | Status: DC

## 2017-06-20 MED ORDER — OXYCODONE-ACETAMINOPHEN 5-325 MG PO TABS: 1.0000 | ORAL_TABLET | Freq: Four times a day (QID) | ORAL | 0 refills | Status: DC | PRN

## 2017-06-20 MED ORDER — NIFEDIPINE ER 30 MG PO TB24: 30.0000 mg | ORAL_TABLET | Freq: Every day | ORAL | 0 refills | Status: DC

## 2017-06-20 MED ORDER — SIMETHICONE 80 MG PO CHEW: 80.0000 mg | CHEWABLE_TABLET | Freq: Four times a day (QID) | ORAL | 0 refills | Status: DC | PRN

## 2017-06-20 NOTE — Progress Notes (Signed)
Daily Postpartum Note  Admission Date: 06/15/2017 Current Date: 06/20/2017 9:20 AM  Geanine Vandekamp is a 27 y.o. [id number] POD#3 s/p rLTCS and BTL @ 30/2, admitted for severe pre-eclampsia.  Pregnancy complicated by: Patient Active Problem List   Diagnosis Date Noted  . Preeclampsia, severe, third trimester 06/15/2017  . Bradycardia 05/25/2017  . Vaginal bleeding in pregnancy 05/19/2017  . ?PPROM on 4/29 05/19/2017  . Previous cesarean section 05/01/2017  . UTI (urinary tract infection) during pregnancy, first trimester 05/01/2017  . Supervision of high-risk pregnancy 02/05/2017  . Smoker 02/05/2017  . Anxiety 01/20/2017  . History of preterm delivery, currently pregnant 01/08/2017  . Hx of preeclampsia, prior pregnancy, currently pregnant 01/08/2017    Overnight/24hr events:  Had a severe range BP this morning (asymptomatic); qday procardia given early and repeat showed better BP  Subjective:  Doing well, +BM. No s/s of pre-eclampsia  Objective:    Current Vital Signs 24h Vital Sign Ranges  T 98.7 F (37.1 C) Temp  Avg: 98.5 F (36.9 C)  Min: 98.4 F (36.9 C)  Max: 98.7 F (37.1 C)  BP (!) 157/95 BP  Min: 127/82  Max: 170/98  HR 60 Pulse  Avg: 62.7  Min: 56  Max: 69  RR 18 Resp  Avg: 17.3  Min: 16  Max: 18  SaO2 100 % Room Air SpO2  Avg: 99.6 %  Min: 99 %  Max: 100 %       24 Hour I/O Current Shift I/O  Time Ins Outs No intake/output data recorded. No intake/output data recorded.   Patient Vitals for the past 24 hrs:  BP Temp Temp src Pulse Resp SpO2  06/20/17 0907 (!) 157/95 - - 60 18 -  06/20/17 0834 (!) 170/98 98.7 F (37.1 C) Oral (!) 56 18 100 %  06/20/17 0506 133/86 98.6 F (37 C) Oral 62 16 99 %  06/20/17 0038 (!) 134/95 98.4 F (36.9 C) Oral 65 17 100 %  06/19/17 1939 127/82 98.4 F (36.9 C) Oral 69 17 100 %  06/19/17 1149 130/77 98.6 F (37 C) Oral 64 18 99 %    Physical exam: General: Well nourished, well developed female in no acute  distress. Abdomen: firm fundus below umbilicus, c/d/i dressing, +BS Cardiovascular: S1, S2 normal, no murmur, rub or gallop, regular rate and rhythm Respiratory: CTAB Extremities: no clubbing, cyanosis. Trace edema in b/l LE Skin: Warm and dry.   Medications: Current Facility-Administered Medications  Medication Dose Route Frequency Provider Last Rate Last Dose  . acetaminophen (TYLENOL) tablet 650 mg  650 mg Oral Q4H PRN [place] Bing, MD   650 mg at 06/17/17 2101  . coconut oil  1 application Topical PRN [place] Bing, MD   1 application at 06/18/17 1006  . witch hazel-glycerin (TUCKS) pad 1 application  1 application Topical PRN [place] Bing, MD       And  . dibucaine (NUPERCAINAL) 1 % rectal ointment 1 application  1 application Rectal PRN [place] Bing, MD      . diphenhydrAMINE (BENADRYL) capsule 25 mg  25 mg Oral Q6H PRN [place] Bing, MD      . diphenhydrAMINE (BENADRYL) injection 12.5 mg  12.5 mg Intravenous Q4H PRN Ellender, Catheryn Bacon, MD       Or  . diphenhydrAMINE (BENADRYL) capsule 25 mg  25 mg Oral Q4H PRN Ellender, Ryan P, MD      . enoxaparin (LOVENOX) injection 40 mg  40 mg Subcutaneous Q24H [place] Bing, MD  40 mg at 06/19/17 0747  . famotidine (PEPCID) tablet 20 mg  20 mg Oral BID PRN [place] BingPickens, Prince Olivier, MD      . hydrALAZINE (APRESOLINE) injection 5-10 mg  5-10 mg Intravenous Q20 Min PRN [place] BingPickens, Nyjah Denio, MD      . ibuprofen (ADVIL,MOTRIN) 100 MG/5ML suspension 600 mg  600 mg Oral Q6H [place] BingPickens, Alzina Golda, MD   600 mg at 06/20/17 0537  . labetalol (NORMODYNE,TRANDATE) injection 20-40 mg  20-40 mg Intravenous Q10 min PRN [place] BingPickens, Annasofia Pohl, MD      . menthol-cetylpyridinium (CEPACOL) lozenge 3 mg  1 lozenge Oral Q2H PRN [place] BingPickens, Klarissa Mcilvain, MD      . nalbuphine (NUBAIN) injection 5 mg  5 mg Intravenous Q4H PRN Ellender, Catheryn Baconyan P, MD       Or  . nalbuphine (NUBAIN) injection 5 mg  5 mg Subcutaneous Q4H PRN Ellender, Catheryn Baconyan P, MD      . nalbuphine (NUBAIN) injection 5  mg  5 mg Intravenous Once PRN Ellender, Catheryn Baconyan P, MD       Or  . nalbuphine (NUBAIN) injection 5 mg  5 mg Subcutaneous Once PRN Ellender, Catheryn Baconyan P, MD      . naloxone (NARCAN) injection 0.4 mg  0.4 mg Intravenous PRN Ellender, Catheryn Baconyan P, MD       And  . sodium chloride flush (NS) 0.9 % injection 3 mL  3 mL Intravenous PRN Ellender, Catheryn Baconyan P, MD      . naloxone HCl (NARCAN) 2 mg in dextrose 5 % 250 mL infusion  1-4 mcg/kg/hr Intravenous Continuous PRN Ellender, Catheryn Baconyan P, MD      . NIFEdipine (PROCARDIA-XL/ADALAT-CC/NIFEDICAL-XL) 24 hr tablet 30 mg  30 mg Oral Daily [place] BingPickens, Reonna Finlayson, MD   30 mg at 06/20/17 0842  . ondansetron (ZOFRAN) injection 4 mg  4 mg Intravenous Q8H PRN Ellender, Catheryn Baconyan P, MD      . oxyCODONE (Oxy IR/ROXICODONE) immediate release tablet 10 mg  10 mg Oral Q4H PRN [place] BingPickens, Jenny Lai, MD   10 mg at 06/20/17 0535  . oxyCODONE (Oxy IR/ROXICODONE) immediate release tablet 5 mg  5 mg Oral Q4H PRN [place] BingPickens, Kerilyn Cortner, MD      . prenatal vitamin w/FE, FA (NATACHEW) chewable tablet 1 tablet  1 tablet Oral Q1200 [place] BingPickens, Pinchus Weckwerth, MD   1 tablet at 06/19/17 1223  . scopolamine (TRANSDERM-SCOP) 1 MG/3DAYS 1.5 mg  1 patch Transdermal Once Ellender, Catheryn Baconyan P, MD      . senna-docusate (Senokot-S) tablet 2 tablet  2 tablet Oral QHS PRN [place] BingPickens, Jahn Franchini, MD   2 tablet at 06/18/17 1231  . simethicone (MYLICON) chewable tablet 80 mg  80 mg Oral TID Ilean SkillPC Alastor Kneale, MD   80 mg at 06/20/17 0842    Labs:  Recent Labs  Lab 06/17/17 0937 06/17/17 2050 06/18/17 0824  WBC 23.6* 28.4* 21.8*  HGB 11.9* 10.7* 9.9*  HCT 35.5* 32.2* 30.1*  PLT 233 241 220    Recent Labs  Lab 06/17/17 0937 06/17/17 2050 06/18/17 0824  NA 136 133* 133*  K 4.4 3.8 3.8  CL 108 105 103  CO2 18* 16* 20*  BUN 15 14 13   CREATININE 0.51 0.63 0.54  CALCIUM 9.1 7.9* 7.6*  PROT 6.4* 6.0* 5.6*  BILITOT 0.2* 0.1* 0.3  ALKPHOS 98 92 83  ALT 22 21 16   AST 25 32 23  GLUCOSE 98 165* 125*     Radiology: no new  imaging  Assessment & Plan:  Pt doing well *Postpartum: routine care. Pumping, A  POS *Severe pre-x: will continue with q4h BP checks. Possible late d/c to home today if BPs stay acceptable *Anemia: no s/s. Continue iron.  *PPx: lovenox, SCDs, OOB *FEN/GI: regular diet, SLIV  *Dispo: possibly late today or tomorrow. Has BP check visit for next week already  Cornelia Copa. MD Attending Center for [facility])

## 2017-06-20 NOTE — Discharge Instructions (Signed)
°Cesarean Delivery, Care After °Refer to this sheet in the next few weeks. These instructions provide you with information on caring for yourself after your procedure. Your health care provider may also give you specific instructions. Your treatment has been planned according to current medical practices, but problems sometimes occur. Call your health care provider if you have any problems or questions after you go home. °HOME CARE INSTRUCTIONS  °· Only take over-the-counter or prescription medications as directed by your health care provider. °· Do not drink alcohol, especially if you are breastfeeding or taking medication to relieve pain. °· Do not  smoke tobacco. °· Continue to use good perineal care. Good perineal care includes: °¨ Wiping your perineum from front to back. °¨ Keeping your perineum clean. °· Check your surgical cut (incision) daily for increased redness, drainage, swelling, or separation of skin. °· Shower and clean your incision gently with soap and water every day, by letting warm and soapy water run over the incision, and then pat it dry. If your health care provider says it is okay, leave the incision uncovered. Use a bandage (dressing) if the incision is draining fluid or appears irritated. If the adhesive strips across the incision do not fall off within 7 days, carefully peel them off, after a shower. °· Hug a pillow when coughing or sneezing until your incision is healed. This helps to relieve pain. °· Do not use tampons, douches or have sexual intercourse, until your health care provider says it is okay. °· Wear a well-fitting bra that provides breast support. °· Limit wearing support panties or control-top hose. °· Drink enough fluids to keep your urine clear or pale yellow. °· Eat high-fiber foods such as whole grain cereals and breads, brown rice, beans, and fresh fruits and vegetables every day. These foods may help prevent or relieve constipation. °· Resume activities such as  climbing stairs, driving, lifting, exercising, or traveling as directed by your health care provider. °· Try to have someone help you with your household activities and your newborn for at least a few days after you leave the hospital. °· Rest as much as possible. Try to rest or take a nap when your newborn is sleeping. °· Increase your activities gradually. °· Do not lift more than 15lbs until directed by a provider. °· Keep all of your scheduled postpartum appointments. It is very important to keep your scheduled follow-up appointments. At these appointments, your health care provider will be checking to make sure that you are healing physically and emotionally. °SEEK MEDICAL CARE IF:  °· You are passing large clots from your vagina. Save any clots to show your health care provider. °· You have a foul smelling discharge from your vagina. °· You have trouble urinating. °· You are urinating frequently. °· You have pain when you urinate. °· You have a change in your bowel movements. °· You have increasing redness, pain, or swelling near your incision. °· You have pus draining from your incision. °· Your incision is separating. °· You have painful, hard, or reddened breasts. °· You have a severe headache. °· You have blurred vision or see spots. °· You feel sad or depressed. °· You have thoughts of hurting yourself or your newborn. °· You have questions about your care, the care of your newborn, or medications. °· You are dizzy or light-headed. °· You have a rash. °· You have pain, redness, or swelling at the site of the removed intravenous access (IV) tube. °· You have nausea   or vomiting.  You stopped breastfeeding and have not had a menstrual period within 12 weeks of stopping.  You are not breastfeeding and have not had a menstrual period within 12 weeks of delivery.  You have a fever. SEEK IMMEDIATE MEDICAL CARE IF:  You have persistent pain.  You have chest pain.  You have shortness of breath.  You  faint.  You have leg pain.  You have stomach pain.  Your vaginal bleeding saturates 2 or more sanitary pads in 1 hour. MAKE SURE YOU:   Understand these instructions.  Will watch your condition.  Will get help right away if you are not doing well or get worse. Document Released: 09/29/2001 Document Revised: 05/24/2013 Document Reviewed: 09/04/2011 [facility] Patient Information 2015 [place], [place]. This information is not intended to replace advice given to you by your health care provider. Make sure you discuss any questions you have with your health care provider.   Preeclampsia and EclampsiaWhat are the signs or symptoms? The earliest signs of preeclampsia are:  High blood pressure.  Increased protein in your urine. Your health care provider will check for this at every visit before you give birth (prenatal visit).  Other symptoms that may develop as the condition gets worse include:  Severe headaches.  Sudden weight gain.  Swelling of the hands, face, legs, and feet.  Nausea and vomiting.  Vision problems, such as blurred or double vision.  Numbness in the face, arms, legs, and feet.  Urinating less than usual.  Dizziness.  Slurred speech.  Abdominal pain, especially upper abdominal pain.  Convulsions or seizures.  Symptoms generally go away after giving birth.  Follow these instructions at home: Eating and drinking   Drink enough fluid to keep your urine clear or pale yellow.  Eat a healthy diet that is low in sodium. Do not add salt to your food. Check nutrition labels to see how much sodium a food or beverage contains.  Avoid caffeine. Lifestyle  Do not use any products that contain nicotine or tobacco, such as cigarettes and e-cigarettes. If you need help quitting, ask your health care provider.  Do not use alcohol or drugs.  Avoid stress as much as possible. Rest and get plenty of sleep. General instructions  Take over-the-counter and  prescription medicines only as told by your health care provider.  When lying down, lie on your side. This keeps pressure off of your baby.  When sitting or lying down, raise (elevate) your feet. Try putting some pillows underneath your lower legs.  Exercise regularly. Ask your health care provider what kinds of exercise are best for you.  Keep all follow-up and prenatal visits as told by your health care provider. This is important. How is this prevented? To prevent preeclampsia or eclampsia from developing during another pregnancy:  Get proper medical care during pregnancy. Your health care provider may be able to prevent preeclampsia or diagnose and treat it early.  Your health care provider may have you take a low-dose aspirin or a calcium supplement during your next pregnancy.  You may have tests of your blood pressure and kidney function after giving birth.  Maintain a healthy weight. Ask your health care provider for help managing weight gain during pregnancy.  Work with your health care provider to manage any long-term (chronic) health conditions you have, such as diabetes or kidney problems.  Contact a health care provider if:  You gain more weight than expected.  You have headaches.  You have nausea  or vomiting.  You have abdominal pain.  You feel dizzy or light-headed. Get help right away if:  You develop sudden or severe swelling anywhere in your body. This usually happens in the legs.  You gain 5 lbs (2.3 kg) or more during one week.  You have severe: ? Abdominal pain. ? Headaches. ? Dizziness. ? Vision problems. ? Confusion. ? Nausea or vomiting.  You have a seizure.  You have trouble moving any part of your body.  You develop numbness in any part of your body.  You have trouble speaking.  You have any abnormal bleeding.  You pass out. This information is not intended to replace advice given to you by your health care provider. Make sure you  discuss any questions you have with your health care provider. Document Released: 01/05/2000 Document Revised: 09/05/2015 Document Reviewed: 08/14/2015 Elsevier Interactive Patient Education  [employer].

## 2017-06-20 NOTE — Discharge Summary (Signed)
Obstetrical Discharge Summary  Date of Admission: 06/15/2017 Date of Discharge: 06/20/2017  Primary OB: [facility]  Gestational Age at Delivery: 546w1d   Antepartum complications chronic abruption, tobacco abuse, history of prior c-section and preterm delivery,  Reason for Admission: pre-eclampsia with severe features Date of Delivery: 06/17/2017  Delivered By: Cornelia Copaharlie Trevonne Nyland, Jr MD Delivery Type: rLTCS and BTL via modified [place] Intrapartum complications/course: Oligohydramnios, FGR Anesthesia: spinal Placenta: Delivered and expressed via active management. Intact: yes. To pathology: yes.  Laceration: n/a Episiotomy: n/a EBL: 270mL Baby: Liveborn female, APGARs 6/9, weight 900 g.    Discharge Diagnosis: Delivered. History of pre-eclampsia with severe features. S/p rpt c-section and BTL  Postpartum course:  *PP: routine care. Pt was meeting all post op goals including BM by day of discharge *CV: patient received 24h of PP Mg. Her labetalol was stopped but she was continued on the procardia 30mg  xl qday pp. Her BPs would increase before her medication was due but she was asymptomatic and her BP responded well to the PO medication.  *anemia: continue on iron Discharge Vital Signs:  Current Vital Signs 24h Vital Sign Ranges  T 98.1 F (36.7 C) Temp  Avg: 98.4 F (36.9 C)  Min: 98.1 F (36.7 C)  Max: 98.7 F (37.1 C)  BP 131/87 BP  Min: 127/82  Max: 170/98  HR 87 Pulse  Avg: 66  Min: 56  Max: 87  RR 18 Resp  Avg: 17.4  Min: 16  Max: 18  SaO2 99 % Room Air SpO2  Avg: 99.6 %  Min: 99 %  Max: 100 %       24 Hour I/O Current Shift I/O  Time Ins Outs No intake/output data recorded. No intake/output data recorded.    Patient Vitals for the past 6 hrs:  BP Temp Temp src Pulse Resp SpO2  06/20/17 1649 131/87 98.1 F (36.7 C) Oral 87 18 99 %  06/20/17 1125 (!) 147/92 - - 63 18 -    Discharge Exam:  General: Well nourished, well developed female in no acute  distress. Abdomen: firm fundus below umbilicus, c/d/i dressing, +BS Cardiovascular: S1, S2 normal, no murmur, rub or gallop, regular rate and rhythm Respiratory: CTAB Extremities: no clubbing, cyanosis. Trace edema in b/l LE Skin: Warm and dry.   Recent Labs  Lab 06/17/17 0937 06/17/17 2050 06/18/17 0824  WBC 23.6* 28.4* 21.8*  HGB 11.9* 10.7* 9.9*  HCT 35.5* 32.2* 30.1*  PLT 233 241 220    Disposition: Home  Rh Immune globulin given: not applicable Rubella vaccine given: not applicable Tdap vaccine given in AP or PP setting: ordered Flu vaccine given in AP or PP setting: n/a  Contraception: BTL (done)  Prenatal/Postnatal Panel: A POS//Rubella Immune//Varicella Unknown//RPR negative//HIV negative/HepB Surface Ag negative//pap no abnormalities (date: 2018)//plans to breastfeed  Plan:  Theodoro KosHeather Draughn was discharged to home in good condition. Follow-up appointment with FT in 1 week for a BP and incision check visit  Future Appointments  Date Time Provider Department Center  06/24/2017 12:00 PM Lazaro ArmsEure, Luther H, MD [facility]    Discharge Medications: Allergies as of 06/20/2017   No Known Allergies     Medication List    STOP taking these medications   labetalol 200 MG tablet Commonly known as:  NORMODYNE     TAKE these medications   acetaminophen 325 MG tablet Commonly known as:  TYLENOL Take 2 tablets (650 mg total) by mouth every 6 (six) hours as needed for mild pain  or fever (for pain scale < 4).   butalbital-acetaminophen-caffeine 50-325-40 MG tablet Commonly known as:  FIORICET, ESGIC Take 1 tablet by mouth every 6 (six) hours as needed for headache.   calcium carbonate 500 MG chewable tablet Commonly known as:  TUMS - dosed in mg elemental calcium Chew 1-2 tablets by mouth daily.   famotidine 20 MG tablet Commonly known as:  PEPCID Take 20 mg by mouth as needed for heartburn or indigestion.   ferrous gluconate 324 MG tablet Commonly known as:   FERGON Take 1 tablet (324 mg total) by mouth daily with breakfast.   ibuprofen 100 MG/5ML suspension Commonly known as:  ADVIL,MOTRIN Take 30 mLs (600 mg total) by mouth every 6 (six) hours as needed.   NIFEdipine 30 MG 24 hr tablet Commonly known as:  PROCARDIA-XL/ADALAT CC Take 1 tablet (30 mg total) by mouth daily. Start taking on:  06/21/2017   ondansetron 4 MG disintegrating tablet Commonly known as:  ZOFRAN-ODT Take 1 tablet (4 mg total) by mouth every 6 (six) hours as needed for nausea or vomiting.   oxyCODONE-acetaminophen 5-325 MG tablet Commonly known as:  PERCOCET/ROXICET Take 1-2 tablets by mouth every 6 (six) hours as needed.   polyethylene glycol packet Commonly known as:  MIRALAX / GLYCOLAX Take 17 g by mouth daily. Start taking on:  06/21/2017   PRENATAL GUMMIES/DHA & FA 0.4-32.5 MG Chew Chew 2 tablets by mouth daily.   simethicone 80 MG chewable tablet Commonly known as:  MYLICON Chew 1 tablet (80 mg total) by mouth 4 (four) times daily as needed for flatulence.       Cornelia Copa MD Attending Center for [facility])

## 2017-06-20 NOTE — Clinical Social Work Maternal (Signed)
CLINICAL SOCIAL WORK MATERNAL/CHILD NOTE  Patient Details  Name: Kimberly Foster MRN: [id number] Date of Birth: 1990/04/11  Date:  06/20/2017  Clinical Social Worker Initiating Note:  Terri Piedra, [place] Date/Time: Initiated:  06/20/17/1000     Child's Name:  Kimberly Foster   Biological Parents:  Mother, Father(Zoeya Schroth and Romie Jumper)   Need for Interpreter:  None   Reason for Referral:  Parental Support of Premature Babies < 32 weeks/or Critically Ill babies   Address:  [address] [place] [address]    Phone number:  [phone] (home)     Additional phone number:   Household Members/Support Persons (HM/SP):   Household Member/Support Person 1, Household Member/Support Person 2   HM/SP Name Relationship DOB or Age  HM/SP -[place] FOB    HM/SP -[place] son 08/01/12  HM/SP -3        HM/SP -4        HM/SP -5        HM/SP -6        HM/SP -7        HM/SP -8          Natural Supports (not living in the home):  Friends, Parent, Immediate Family, Extended Family(MOB's family live in [place].  Her uncle lives in [place] and is a good support.  FOB's family lives in [place] and are supportive.)   Professional Supports: None   Employment:     Type of Work: MOB was working for [employer] prior to delivery, but does not qualify for [employer] because she has only been there 6 months.  FOB work Arboriculturist at [employer] in [place].   Education:      Homebound arranged:    Financial Resources:  Private Insurance(FOB has BCBS and plans to add baby to his policy.  MOB states she is also on his policy.)   Other Resources:      Cultural/Religious Considerations Which May Impact Care: MOB states "no."  Strengths:  Ability to meet basic needs , Home prepared for child    Psychotropic Medications:         Pediatrician:       Pediatrician List:   [facility], [place]      Pediatrician Fax Number:    Risk Factors/Current Problems:  None(Will provide gas cards since family is traveling from [place], [place].)   Cognitive State:  Alert , Able to Concentrate , Linear Thinking , Insightful , Goal Oriented    Mood/Affect:  Euthymic , Calm , Interested    CSW Assessment: CSW met with MOB in her third floor room/319 to offer support and complete assessment due to baby's premature birth and admission to NICU at 30.1 weeks.  MOB was pleasant and welcoming of CSW's visit.  FOB was asleep on the pull out bed and MOB gave permission to speak openly if he woke up during our conversation.  CSW found MOB to be soft spoken, but very easy to engage. MOB states she is sore, but overall feeling well physically.  She appears to be coping very well emotionally and states she feels like having had this experience before has helped her prepare for this time around.  CSW asked how she feels she coped with her first premature delivery (a son at 80 weeks in 54 in [place], [place]).  She  states that looking back on the experience, she thinks she bottled up most of her emotions, but now feels that this "wasn't totally a bad thing."  She feels like it helped get her through the experience.  She feels now knowing that "it's not that hard" and "babies make it out ok" she is not too concerned about her emotions.  CSW encouraged her to allow herself to be emotional, while remembering that she has gotten through this experience successfully before.  She added that FOB is a huge support for her and that she realizes she is lucky to have him.  She stated that she can't imagine going through this alone.  She spoke about a positive support system in her family and friends as well.  She reports that her step-mother is coming to visit this weekend from [facility] and that her mother came the day baby was born.  She states that PGF is currently caring for their son, but that he  typically goes to preschool.   MOB reports that the couple share one car and thinks that she will either drop FOB off at work so that she will be able to come to the hospital whenever she wants, or that FOB will get a ride to work with someone else.  She states he works 6am-3pm Monday through Saturday.  She is unsure whether she will be able to go back to her job because they will have to have an opening when she is ready to go back because she did not qualify for FMLA.  She had only been working there for about 6 months.  CSW informed MOB of baby's eligibility to apply for SSI and asked if she received this benefit with her first premature baby.  She states that she did and remembers the process vaguely.  CSW encouraged her to call the [facility] in [place] today if possible since it is the last day of baby's birth month.  CSW instructed her to ask for a "protective filing date" and explained that she will be given a date for a phone interview in the future to apply.  MOB stated understanding.  CSW obtained her signature on a Patient Access form and provided her with a copy of baby's admission note for proof of gestational age and weight to have when she applies.  MOB was thankful for the information and states this benefit will help while she is out of work and for the [employer] that their private insurance will not cover of baby's hospitalization. CSW asked MOB to contact CSW if she and FOB ever want to schedule a Family Conference and explained the benefit of talking away from the bedside, with the multidisciplinary team, about baby's "big picture."  MOB stated understanding and thanked CSW.  She states no questions, concerns or needs at this time.  CSW informed her of Family Support Network support and MOB states baby basics will be helpful.  She states they have a crib.  She would also like a sibling bag for her son.  CSW will make referral to [facility].  CSW left 2 $10 gas cards at baby's bedside.      CSW  Plan/Description:  No Further Intervention Required/No Barriers to Discharge, Psychosocial Support and Ongoing Assessment of Needs, Perinatal Mood and Anxiety Disorder (PMADs) Education, Other Patient/Family Education, [place] (SSI) Information    Alphonzo Cruise, [place] 06/20/2017, 11:58 AM

## 2017-06-24 ENCOUNTER — Ambulatory Visit (INDEPENDENT_AMBULATORY_CARE_PROVIDER_SITE_OTHER): Payer: BLUE CROSS/BLUE SHIELD | Admitting: Obstetrics & Gynecology

## 2017-06-24 ENCOUNTER — Encounter: Payer: Self-pay | Admitting: Obstetrics & Gynecology

## 2017-06-24 DIAGNOSIS — Z9889 Other specified postprocedural states: Secondary | ICD-10-CM

## 2017-06-24 NOTE — Progress Notes (Signed)
  HPI: Patient returns for routine postoperative follow-up having undergone repeat C section + BTL on 06/17/2017.  The patient's immediate postoperative recovery has been unremarkable. Since hospital discharge the patient reports no problems.   Current Outpatient Medications: ibuprofen (ADVIL,MOTRIN) 100 MG/5ML suspension, Take 30 mLs (600 mg total) by mouth every 6 (six) hours as needed., Disp: 30 mL, Rfl: 0 NIFEdipine (PROCARDIA-XL/ADALAT CC) 30 MG 24 hr tablet, Take 1 tablet (30 mg total) by mouth daily., Disp: 30 tablet, Rfl: 0 oxyCODONE-acetaminophen (PERCOCET/ROXICET) 5-325 MG tablet, Take 1-2 tablets by mouth every 6 (six) hours as needed., Disp: 30 tablet, Rfl: 0 Prenatal MV-Min-FA-Omega-3 (PRENATAL GUMMIES/DHA & FA) 0.4-32.5 MG CHEW, Chew 2 tablets by mouth daily., Disp: , Rfl:  acetaminophen (TYLENOL) 325 MG tablet, Take 2 tablets (650 mg total) by mouth every 6 (six) hours as needed for mild pain or fever (for pain scale < 4). (Patient not taking: Reported on 06/24/2017), Disp: 30 tablet, Rfl: 0 butalbital-acetaminophen-caffeine (FIORICET, ESGIC) 50-325-40 MG tablet, Take 1 tablet by mouth every 6 (six) hours as needed for headache. (Patient not taking: Reported on 06/12/2017), Disp: 20 tablet, Rfl: 0 calcium carbonate (TUMS - DOSED IN MG ELEMENTAL CALCIUM) 500 MG chewable tablet, Chew 1-2 tablets by mouth daily., Disp: , Rfl:  famotidine (PEPCID) 20 MG tablet, Take 20 mg by mouth as needed for heartburn or indigestion., Disp: , Rfl:  ferrous gluconate (FERGON) 324 MG tablet, Take 1 tablet (324 mg total) by mouth daily with breakfast. (Patient not taking: Reported on 06/24/2017), Disp: 45 tablet, Rfl: 0 ondansetron (ZOFRAN-ODT) 4 MG disintegrating tablet, Take 1 tablet (4 mg total) by mouth every 6 (six) hours as needed for nausea or vomiting. (Patient not taking: Reported on 06/24/2017), Disp: 30 tablet, Rfl: 1 polyethylene glycol (MIRALAX / GLYCOLAX) packet, Take 17 g by mouth daily. (Patient  not taking: Reported on 06/24/2017), Disp: 30 each, Rfl: 1 simethicone (MYLICON) 80 MG chewable tablet, Chew 1 tablet (80 mg total) by mouth 4 (four) times daily as needed for flatulence. (Patient not taking: Reported on 06/24/2017), Disp: 30 tablet, Rfl: 0  No current facility-administered medications for this visit.     Last menstrual period 11/18/2016, unknown if currently breastfeeding.  Physical Exam: Incision clean dry intact some bruising  Diagnostic Tests:   Pathology:   Impression: S/p repeat C section  Plan: Continue procardia xl 30 mg  Follow up: 4  weeks  Lazaro ArmsLuther H Deontay Ladnier, MD

## 2017-07-22 ENCOUNTER — Ambulatory Visit (INDEPENDENT_AMBULATORY_CARE_PROVIDER_SITE_OTHER): Payer: BLUE CROSS/BLUE SHIELD | Admitting: Obstetrics & Gynecology

## 2017-07-22 ENCOUNTER — Encounter: Payer: Self-pay | Admitting: Obstetrics & Gynecology

## 2017-07-22 VITALS — BP 131/88 | HR 94 | Ht 61.0 in | Wt 133.0 lb

## 2017-07-22 DIAGNOSIS — Z9889 Other specified postprocedural states: Secondary | ICD-10-CM | POA: Diagnosis not present

## 2017-07-22 MED ORDER — NIFEDIPINE ER 30 MG PO TB24: 30.0000 mg | ORAL_TABLET | Freq: Every day | ORAL | 11 refills | Status: AC

## 2017-07-22 NOTE — Progress Notes (Signed)
  HPI: Patient returns for routine postoperative follow-up having undergone repeat c section with tubal ligation on 06/17/2017.  The patient's immediate postoperative recovery has been unremarkable. Since hospital discharge the patient reports muscle soreness.   Current Outpatient Medications: acetaminophen (TYLENOL) 325 MG tablet, Take 2 tablets (650 mg total) by mouth every 6 (six) hours as needed for mild pain or fever (for pain scale < 4)., Disp: 30 tablet, Rfl: 0 butalbital-acetaminophen-caffeine (FIORICET, ESGIC) 50-325-40 MG tablet, Take 1 tablet by mouth every 6 (six) hours as needed for headache., Disp: 20 tablet, Rfl: 0 ibuprofen (ADVIL,MOTRIN) 200 MG tablet, Take 400 mg by mouth as needed., Disp: , Rfl:  NIFEdipine (PROCARDIA-XL/ADALAT CC) 30 MG 24 hr tablet, Take 1 tablet (30 mg total) by mouth daily., Disp: 30 tablet, Rfl: 0  No current facility-administered medications for this visit.     Blood pressure 131/88, pulse 94, height 5\' 1"  (1.549 m), weight 133 lb (60.3 kg), last menstrual period 11/18/2016, not currently breastfeeding.  Physical Exam: Incision clean dry intact  Diagnostic Tests:   Pathology:   Impression: S/p repeat C section with BTL Hypertension, stay on procardia xl 30 mg  Plan: Normal post op course  Follow up: 3 months  Lazaro ArmsLuther H Dewey Viens, MD

## 2017-10-22 ENCOUNTER — Other Ambulatory Visit: Payer: BLUE CROSS/BLUE SHIELD | Admitting: Adult Health

## 2017-12-11 ENCOUNTER — Other Ambulatory Visit: Payer: BLUE CROSS/BLUE SHIELD | Admitting: Adult Health

## 2019-09-13 ENCOUNTER — Ambulatory Visit: Payer: Medicaid Other | Admitting: Obstetrics & Gynecology

## 2019-12-25 IMAGING — US US MFM OB LIMITED
2 series · 15 of 17 positions shown · non-contrast
Comparison: none

[Series 1: us mfm ob limited · 16 acquisitions, 14 frames shown (1 of 2)]
[im 1/16]
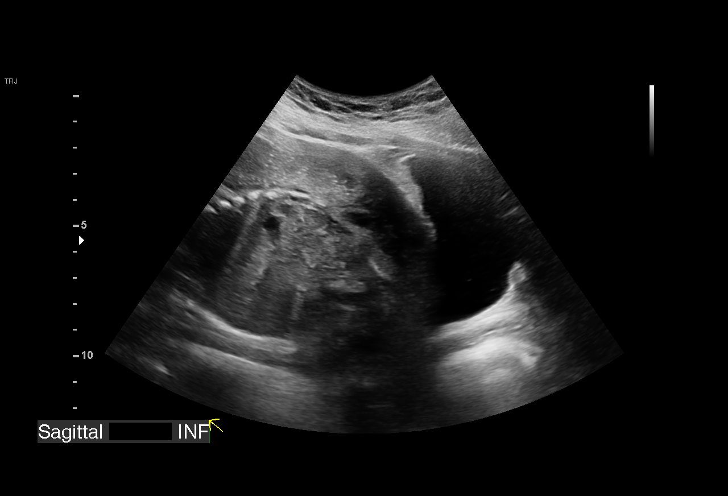
[im 2/16]
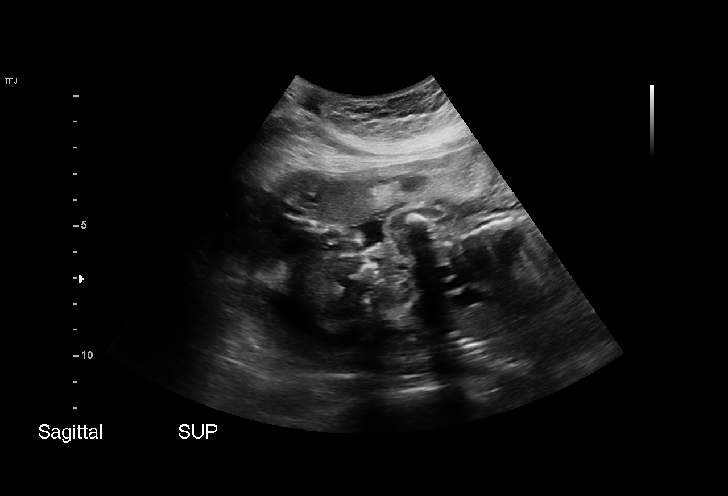
[im 3/16]
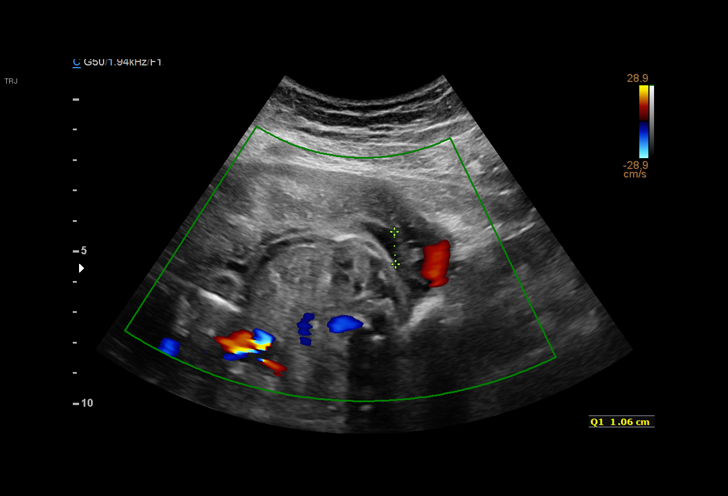
[im 4/16]
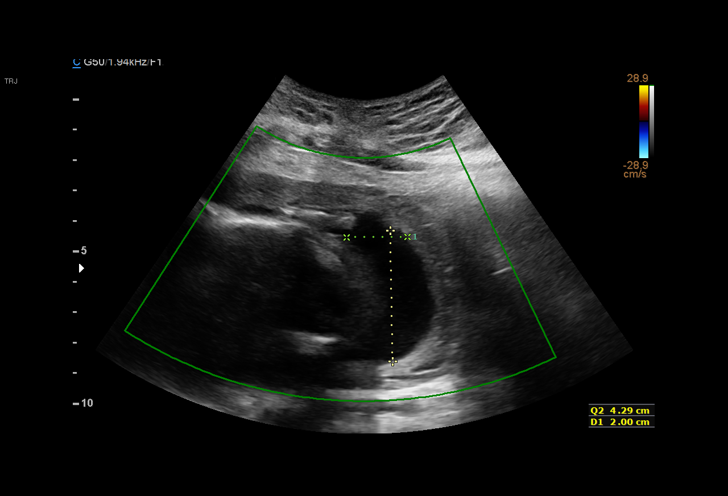
[im 6/16]
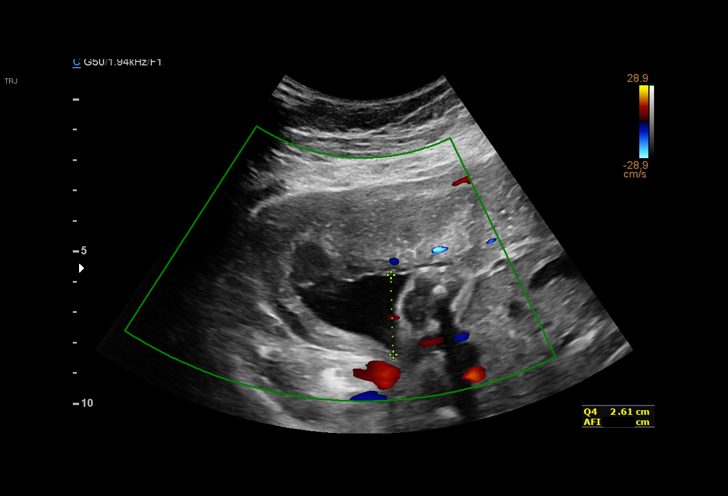
[im 7/16]
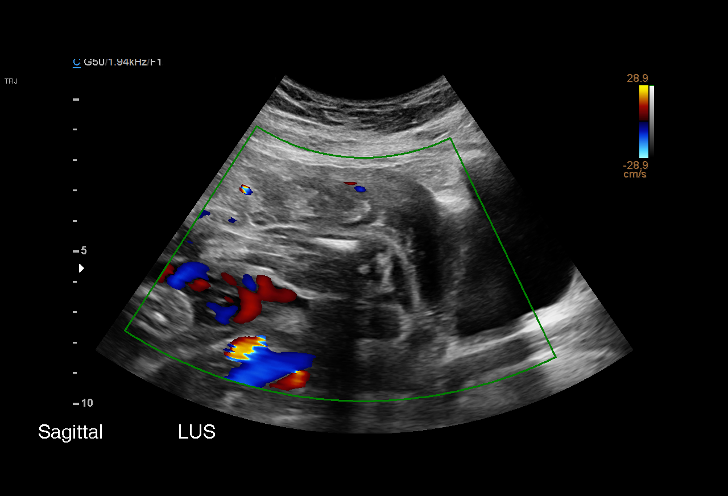
[im 8/16]
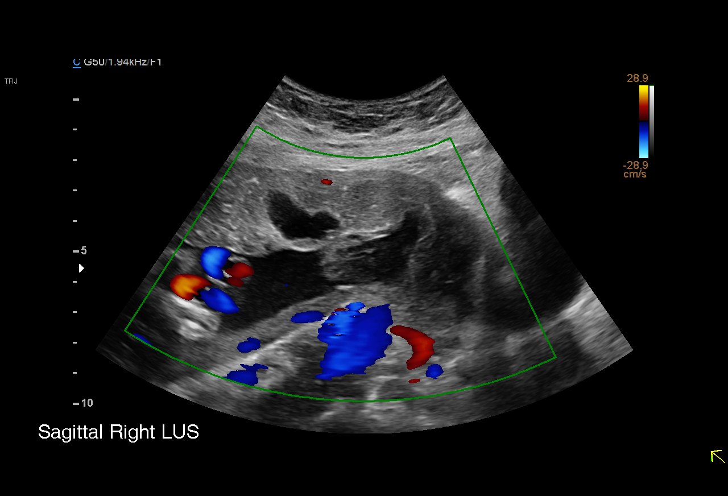
[im 9/16]
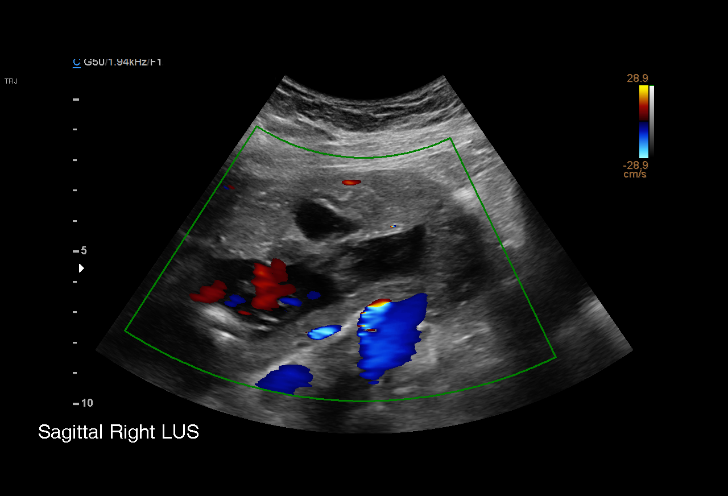
[im 10/16]
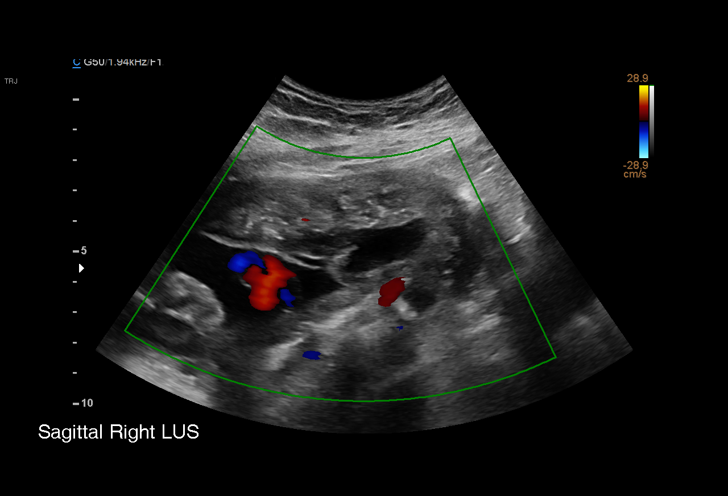
[im 11/16]
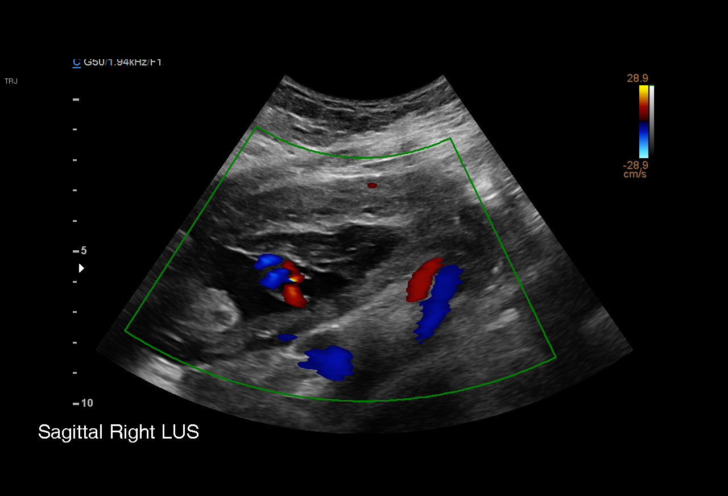
[im 12/16]
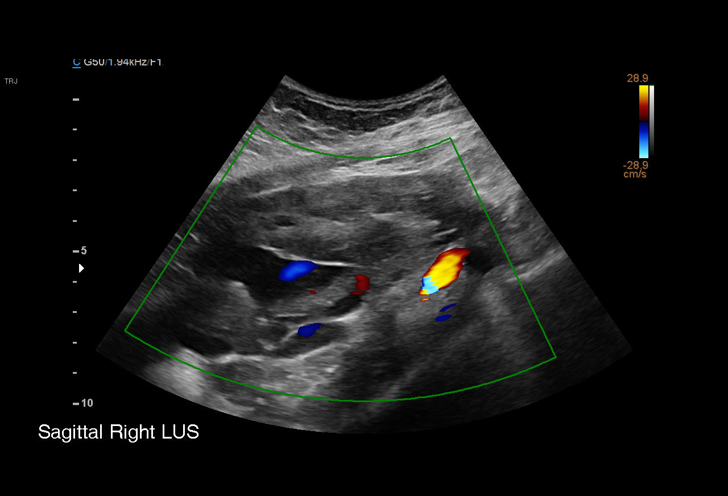
[im 14/16]
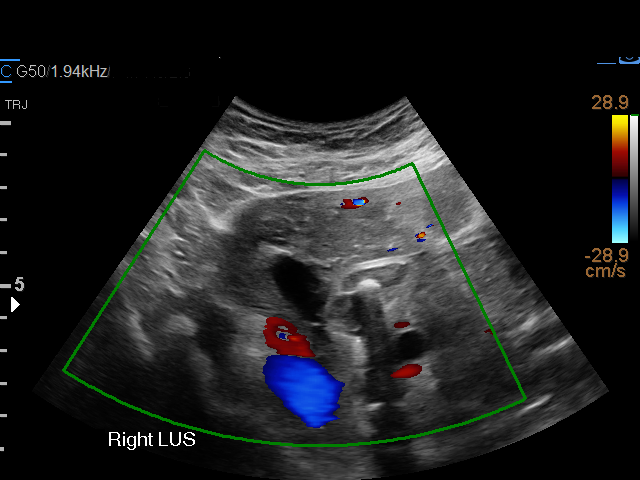
[im 15/16]
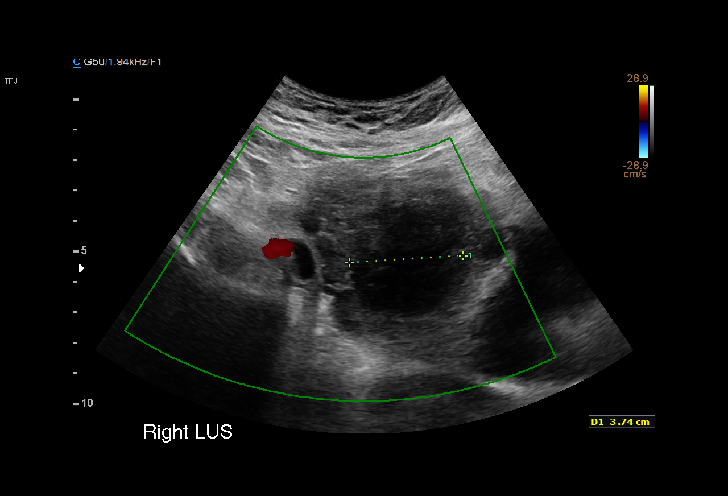
[im 16/16]
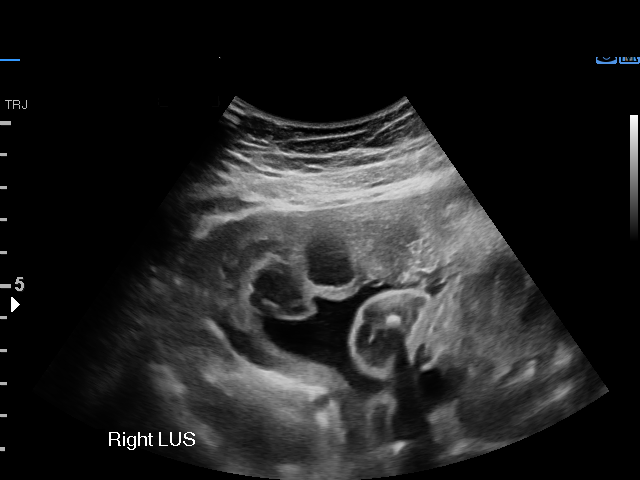

[Series 3: us mfm ob limited · 1 of 1 slices shown (2 of 2)]
[im 1/1]
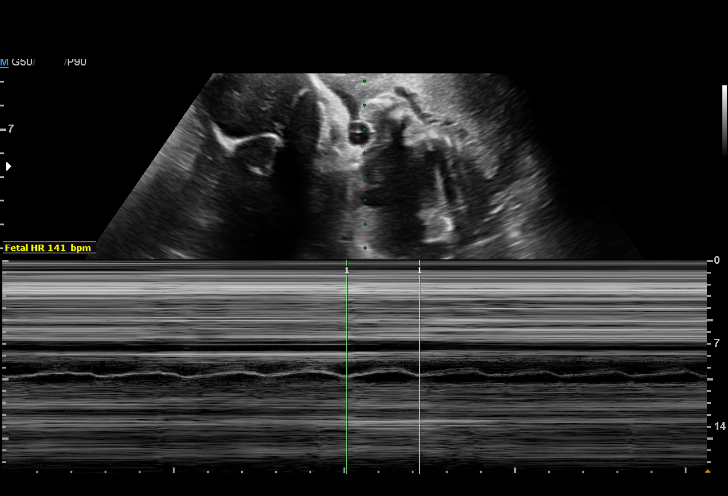

[15 of 17 positions shown; findings below may reference images not displayed]

MAU/Triage
SARFRAZ CNM

1  DWIGHT ACREE          [id number]      [id number]     [id number]
Indications

27 weeks gestation of pregnancy

Vaginal bleeding in pregnancy, second
trimester
History of cesarean delivery, currently
pregnant
Poor obstetric history: Previous
preeclampsia / eclampsia/gestational HTN
Poor obstetric history: Previous preterm
delivery, antepartum (31 weeks due to
Preeclampsia)
Tobacco use complicating pregnancy,
second trimester
OB History

Blood Type:            Height:  5'1"   Weight (lb):  139       BMI:
Fetal Evaluation

Num Of Fetuses:     1
Fetal Heart         141
Rate(bpm):
Cardiac Activity:   Observed
Presentation:       Breech
Placenta:           Anterior, above cervical os
Amniotic Fluid
AFI FV:      Subjectively within normal limits

AFI Sum(cm)     %Tile       Largest Pocket(cm)
10.94           18

RUQ(cm)       RLQ(cm)       LUQ(cm)        LLQ(cm)
1.06
Gestational Age

Clinical EDD:  27w 0d                                        EDD:   08/25/17
Best:          27w 0d     Det. By:  Clinical EDD             EDD:   08/25/17
Cervix Uterus Adnexa

Comment:      Membranes and hypoechoic echoes seen
covering cervical os extending to the right lus.
Impression

Singleton intrauterine pregnancy at 27+0 weeks with vaginal
bleeding here for requested limited scan
Presentation is breech
Normal fetal movement and cardiac activity
Amniotic fluid volume is normal with AFI 10.9 cm
There is a subchorionic fluid collection near the internal os
Recommendations

Continue clinical evaluation and management.

## 2021-09-10 ENCOUNTER — Encounter (HOSPITAL_COMMUNITY): Payer: Self-pay

## 2021-09-10 ENCOUNTER — Emergency Department (HOSPITAL_COMMUNITY)
Admission: EM | Admit: 2021-09-10 | Discharge: 2021-09-10 | Disposition: A | Discharge status: Home / Self Care | Payer: Medicaid Other | Attending: Emergency Medicine | Admitting: Emergency Medicine

## 2021-09-10 DIAGNOSIS — L03213 Periorbital cellulitis: Secondary | ICD-10-CM | POA: Insufficient documentation

## 2021-09-10 MED ORDER — AMOXICILLIN-POT CLAVULANATE 400-57 MG/5ML PO SUSR: 875.0000 mg | Freq: Two times a day (BID) | ORAL | 0 refills | Status: AC

## 2021-09-10 NOTE — Discharge Instructions (Signed)
There was a prescription for antibiotics that was sent to your pharmacy.  Take this as prescribed for the next 7 days.  You should have resolution of your swelling.  If you do not or if you have any worsening of symptoms, please return to the emergency department.

## 2021-09-10 NOTE — ED Triage Notes (Signed)
Pt states swelling near left eye. Pt had zit on outside of left eye first noticed Friday that has progressively gotten worse. [facility] sent her here for IV abx.   No vision problems- just swelling in the face

## 2021-09-10 NOTE — ED Provider Notes (Signed)
[facility] EMERGENCY DEPARTMENT Provider Note   CSN: [id number] Arrival date & time: 09/10/21  1403     History {Add pertinent medical, surgical, social history, OB history to HPI:1} Chief Complaint  Patient presents with  . Facial Swelling    Kimberly Foster is a 31 y.o. female.  HPI Patient presents for left periorbital swelling.  Medical history includes anxiety, and migraines.  4 days ago, patient had a pustule on the upper cheek, just lateral to left eye.  She states that she popped it.  Over the past 2 days, she has been experiencing a left periorbital swelling.  Pain is minimal.  She has not had any vision changes.  She has no pain with extraocular movements.  She has not had any systemic symptoms.  She was seen in urgent care earlier today and advised to come to the ED.    Home Medications Prior to Admission medications   Medication Sig Start Date End Date Taking? Authorizing Provider  acetaminophen (TYLENOL) 325 MG tablet Take 2 tablets (650 mg total) by mouth every 6 (six) hours as needed for mild pain or fever (for pain scale < 4). 06/20/17   Mulberry Bing, MD  butalbital-acetaminophen-caffeine (FIORICET, ESGIC) [phone] MG tablet Take 1 tablet by mouth every 6 (six) hours as needed for headache. 02/10/17   Cresenzo-Dishmon, Scarlette Calico, CNM  ibuprofen (ADVIL,MOTRIN) 200 MG tablet Take 400 mg by mouth as needed.    [provider]  NIFEdipine (PROCARDIA-XL/ADALAT CC) 30 MG 24 hr tablet Take 1 tablet (30 mg total) by mouth daily. 07/22/17   Lazaro Arms, MD      Allergies    Patient has no known allergies.    Review of Systems   Review of Systems  HENT:  Positive for facial swelling.   All other systems reviewed and are negative.   Physical Exam Updated Vital Signs BP 119/79 (BP Location: Right Arm)   Pulse 84   Temp 97.6 F (36.4 C) (Oral)   Resp 16   Ht 5\' 1"  (1.549 m)   Wt 46.4 kg   LMP 08/22/2021   SpO2 100%   BMI 19.33 kg/m  Physical  Exam Vitals and nursing note reviewed.  Constitutional:      General: She is not in acute distress.    Appearance: Normal appearance. She is well-developed and normal weight. She is not ill-appearing, toxic-appearing or diaphoretic.  HENT:     Head: Normocephalic and atraumatic.     Right Ear: External ear normal.     Left Ear: External ear normal.     Nose: Nose normal.     Mouth/Throat:     Mouth: Mucous membranes are moist.     Pharynx: Oropharynx is clear.  Eyes:     General: No scleral icterus.       Right eye: No discharge.        Left eye: No discharge.     Extraocular Movements: Extraocular movements intact.     Conjunctiva/sclera: Conjunctivae normal.     Pupils: Pupils are equal, round, and reactive to light.     Comments: Left periorbital swelling.  Cardiovascular:     Rate and Rhythm: Normal rate.  Pulmonary:     Effort: Pulmonary effort is normal. No respiratory distress.  Abdominal:     General: Abdomen is flat. There is no distension.  Musculoskeletal:        General: No swelling or deformity. Normal range of motion.     Cervical back:  Normal range of motion. No rigidity.  Skin:    General: Skin is warm and dry.     Coloration: Skin is not jaundiced or pale.  Neurological:     General: No focal deficit present.     Mental Status: She is alert and oriented to person, place, and time.     Cranial Nerves: No cranial nerve deficit.     Sensory: No sensory deficit.     Motor: No weakness.     Coordination: Coordination normal.  Psychiatric:        Mood and Affect: Mood normal.        Behavior: Behavior normal.        Thought Content: Thought content normal.        Judgment: Judgment normal.    ED Results / Procedures / Treatments   Labs (all labs ordered are listed, but only abnormal results are displayed) Labs Reviewed - No data to display  EKG None  Radiology No results found.  Procedures Procedures  {Document cardiac monitor, telemetry  assessment procedure when appropriate:1}  Medications Ordered in ED Medications - No data to display  ED Course/ Medical Decision Making/ A&P                           Medical Decision Making  This patient presents to the ED for concern of left periorbital swelling, this involves an extensive number of treatment options, and is a complaint that carries with it a high risk of complications and morbidity.  The differential diagnosis includes preseptal cellulitis, orbital cellulitis, allergic reaction   Co morbidities that complicate the patient evaluation  Anxiety and migraines   Additional history obtained:  Additional history obtained from N/A External records from outside source obtained and reviewed including EMR   Problem List / ED Course / Critical interventions / Medication management  Patient is healthy 31 year old female presenting for left periorbital swelling.  The swelling has been present for the past 2 days.  Prior to that, she did have a pustule lateral to her left eye which she noticed 4 days ago.  She did pop this at home.  Patient denies any systemic symptoms.  She has not had any vision changes or pain with extraocular movements.  On arrival in the ED, vital signs are normal.  She is well-appearing on exam.  She has some mild periorbital swelling with minimal tenderness.  There is no evidence of proptosis.  At this point, I do not have any suspicion of orbital cellulitis.  Patient is agreeable to outpatient antibiotics with return if she does have any worsening of symptoms.  She is requested a liquid formulation of antibiotics.  This was prescribed.  Patient was discharged in good condition.  {Document critical care time when appropriate:1} {Document review of labs and clinical decision tools ie heart score, Chads2Vasc2 etc:1}  {Document your independent review of radiology images, and any outside records:1} {Document your discussion with family members, caretakers,  and with consultants:1} {Document social determinants of health affecting pt's care:1} {Document your decision making why or why not admission, treatments were needed:1} Final Clinical Impression(s) / ED Diagnoses Final diagnoses:  None    Rx / DC Orders ED Discharge Orders     None

## 2022-06-21 ENCOUNTER — Ambulatory Visit: Payer: Self-pay | Admitting: Obstetrics & Gynecology
